# Patient Record
Sex: Female | Born: 1985
Health system: Southern US, Community
[De-identification: ages and names within clinical notes are randomized; demographics above are authoritative.]

## PROBLEM LIST (undated history)

## (undated) DIAGNOSIS — G8929 Other chronic pain: Secondary | ICD-10-CM

## (undated) DIAGNOSIS — F419 Anxiety disorder, unspecified: Secondary | ICD-10-CM

## (undated) DIAGNOSIS — G43909 Migraine, unspecified, not intractable, without status migrainosus: Secondary | ICD-10-CM

## (undated) DIAGNOSIS — B009 Herpesviral infection, unspecified: Secondary | ICD-10-CM

## (undated) DIAGNOSIS — M797 Fibromyalgia: Secondary | ICD-10-CM

## (undated) DIAGNOSIS — IMO0002 Reserved for concepts with insufficient information to code with codable children: Secondary | ICD-10-CM

## (undated) DIAGNOSIS — R87619 Unspecified abnormal cytological findings in specimens from cervix uteri: Secondary | ICD-10-CM

## (undated) DIAGNOSIS — M549 Dorsalgia, unspecified: Secondary | ICD-10-CM

## (undated) HISTORY — DX: Reserved for concepts with insufficient information to code with codable children: IMO0002

## (undated) HISTORY — DX: Unspecified abnormal cytological findings in specimens from cervix uteri: R87.619

## (undated) HISTORY — DX: Herpesviral infection, unspecified: B00.9

## (undated) HISTORY — PX: LEEP: SHX91

---
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
## 2004-04-26 ENCOUNTER — Emergency Department (HOSPITAL_COMMUNITY): Admission: EM | Admit: 2004-04-26 | Discharge: 2004-04-26 | Payer: Self-pay | Admitting: Emergency Medicine

## 2004-08-17 ENCOUNTER — Emergency Department (HOSPITAL_COMMUNITY): Admission: EM | Admit: 2004-08-17 | Discharge: 2004-08-17 | Payer: Self-pay | Admitting: Emergency Medicine

## 2005-03-04 ENCOUNTER — Emergency Department (HOSPITAL_COMMUNITY): Admission: EM | Admit: 2005-03-04 | Discharge: 2005-03-05 | Payer: Self-pay | Admitting: *Deleted

## 2006-08-16 ENCOUNTER — Ambulatory Visit (HOSPITAL_COMMUNITY): Admission: AD | Admit: 2006-08-16 | Discharge: 2006-08-16 | Payer: Self-pay | Admitting: Obstetrics and Gynecology

## 2006-09-13 ENCOUNTER — Ambulatory Visit (HOSPITAL_COMMUNITY): Admission: AD | Admit: 2006-09-13 | Discharge: 2006-09-13 | Payer: Self-pay | Admitting: Obstetrics and Gynecology

## 2006-09-19 ENCOUNTER — Observation Stay (HOSPITAL_COMMUNITY): Admission: AD | Admit: 2006-09-19 | Discharge: 2006-09-19 | Payer: Self-pay | Admitting: Obstetrics and Gynecology

## 2006-10-11 ENCOUNTER — Inpatient Hospital Stay (HOSPITAL_COMMUNITY): Admission: AD | Admit: 2006-10-11 | Discharge: 2006-10-13 | Payer: Self-pay | Admitting: Obstetrics and Gynecology

## 2006-11-28 ENCOUNTER — Ambulatory Visit (HOSPITAL_COMMUNITY): Admission: RE | Admit: 2006-11-28 | Discharge: 2006-11-28 | Payer: Self-pay | Admitting: Obstetrics and Gynecology

## 2007-09-08 ENCOUNTER — Emergency Department (HOSPITAL_COMMUNITY): Admission: EM | Admit: 2007-09-08 | Discharge: 2007-09-08 | Payer: Self-pay | Admitting: Emergency Medicine

## 2007-09-23 ENCOUNTER — Emergency Department (HOSPITAL_COMMUNITY): Admission: EM | Admit: 2007-09-23 | Discharge: 2007-09-23 | Payer: Self-pay | Admitting: Emergency Medicine

## 2007-11-29 ENCOUNTER — Emergency Department (HOSPITAL_COMMUNITY): Admission: EM | Admit: 2007-11-29 | Discharge: 2007-11-29 | Payer: Self-pay | Admitting: Emergency Medicine

## 2007-12-01 ENCOUNTER — Emergency Department (HOSPITAL_COMMUNITY): Admission: EM | Admit: 2007-12-01 | Discharge: 2007-12-01 | Payer: Self-pay | Admitting: Emergency Medicine

## 2008-05-28 ENCOUNTER — Encounter: Payer: Self-pay | Admitting: Orthopedic Surgery

## 2008-05-28 ENCOUNTER — Emergency Department (HOSPITAL_COMMUNITY): Admission: EM | Admit: 2008-05-28 | Discharge: 2008-05-28 | Payer: Self-pay | Admitting: Emergency Medicine

## 2008-06-11 ENCOUNTER — Emergency Department (HOSPITAL_COMMUNITY): Admission: EM | Admit: 2008-06-11 | Discharge: 2008-06-11 | Payer: Self-pay | Admitting: Emergency Medicine

## 2008-06-13 ENCOUNTER — Encounter: Payer: Self-pay | Admitting: Orthopedic Surgery

## 2008-06-20 ENCOUNTER — Ambulatory Visit: Payer: Self-pay | Admitting: Orthopedic Surgery

## 2008-06-20 ENCOUNTER — Encounter (INDEPENDENT_AMBULATORY_CARE_PROVIDER_SITE_OTHER): Payer: Self-pay | Admitting: *Deleted

## 2008-06-20 DIAGNOSIS — M24876 Other specific joint derangements of unspecified foot, not elsewhere classified: Secondary | ICD-10-CM

## 2008-06-20 DIAGNOSIS — M25579 Pain in unspecified ankle and joints of unspecified foot: Secondary | ICD-10-CM

## 2008-06-20 DIAGNOSIS — M24873 Other specific joint derangements of unspecified ankle, not elsewhere classified: Secondary | ICD-10-CM

## 2008-10-15 ENCOUNTER — Other Ambulatory Visit: Admission: RE | Admit: 2008-10-15 | Discharge: 2008-10-15 | Payer: Self-pay | Admitting: Obstetrics & Gynecology

## 2008-11-20 ENCOUNTER — Emergency Department (HOSPITAL_COMMUNITY): Admission: EM | Admit: 2008-11-20 | Discharge: 2008-11-20 | Payer: Self-pay | Admitting: Emergency Medicine

## 2008-12-01 ENCOUNTER — Emergency Department (HOSPITAL_COMMUNITY): Admission: EM | Admit: 2008-12-01 | Discharge: 2008-12-01 | Payer: Self-pay | Admitting: Emergency Medicine

## 2009-01-20 ENCOUNTER — Emergency Department (HOSPITAL_COMMUNITY): Admission: EM | Admit: 2009-01-20 | Discharge: 2009-01-20 | Payer: Self-pay | Admitting: Emergency Medicine

## 2009-04-04 ENCOUNTER — Emergency Department (HOSPITAL_COMMUNITY): Admission: EM | Admit: 2009-04-04 | Discharge: 2009-04-04 | Payer: Self-pay | Admitting: Emergency Medicine

## 2009-05-06 ENCOUNTER — Emergency Department (HOSPITAL_COMMUNITY): Admission: EM | Admit: 2009-05-06 | Discharge: 2009-05-06 | Payer: Self-pay | Admitting: Emergency Medicine

## 2009-06-06 ENCOUNTER — Emergency Department (HOSPITAL_COMMUNITY): Admission: EM | Admit: 2009-06-06 | Discharge: 2009-06-06 | Payer: Self-pay | Admitting: Emergency Medicine

## 2009-08-18 ENCOUNTER — Other Ambulatory Visit: Admission: RE | Admit: 2009-08-18 | Discharge: 2009-08-18 | Payer: Self-pay | Admitting: Obstetrics & Gynecology

## 2009-11-28 ENCOUNTER — Emergency Department (HOSPITAL_COMMUNITY): Admission: EM | Admit: 2009-11-28 | Discharge: 2009-11-28 | Payer: Self-pay | Admitting: Emergency Medicine

## 2010-01-24 ENCOUNTER — Emergency Department (HOSPITAL_COMMUNITY): Admission: EM | Admit: 2010-01-24 | Discharge: 2010-01-24 | Payer: Self-pay | Admitting: Emergency Medicine

## 2010-05-31 ENCOUNTER — Emergency Department (HOSPITAL_COMMUNITY): Admission: EM | Admit: 2010-05-31 | Discharge: 2010-05-31 | Payer: Self-pay | Admitting: Emergency Medicine

## 2010-06-01 ENCOUNTER — Emergency Department (HOSPITAL_COMMUNITY): Admission: EM | Admit: 2010-06-01 | Discharge: 2010-06-01 | Payer: Self-pay | Admitting: Emergency Medicine

## 2010-09-24 ENCOUNTER — Emergency Department (HOSPITAL_COMMUNITY): Admission: EM | Admit: 2010-09-24 | Discharge: 2010-09-24 | Payer: Self-pay | Admitting: Emergency Medicine

## 2010-12-20 ENCOUNTER — Encounter: Payer: Self-pay | Admitting: Obstetrics and Gynecology

## 2010-12-20 ENCOUNTER — Encounter: Payer: Self-pay | Admitting: Neurology

## 2011-02-10 LAB — URINALYSIS, ROUTINE W REFLEX MICROSCOPIC
Glucose, UA: NEGATIVE mg/dL
Hgb urine dipstick: NEGATIVE
Nitrite: NEGATIVE
Protein, ur: NEGATIVE mg/dL
Specific Gravity, Urine: 1.02 (ref 1.005–1.030)
Urobilinogen, UA: 1 mg/dL (ref 0.0–1.0)
pH: 6.5 (ref 5.0–8.0)

## 2011-02-10 LAB — PREGNANCY, URINE: Preg Test, Ur: NEGATIVE

## 2011-03-08 LAB — STREP A DNA PROBE

## 2011-03-15 LAB — BASIC METABOLIC PANEL
CO2: 24 mEq/L (ref 19–32)
Calcium: 10.1 mg/dL (ref 8.4–10.5)
Chloride: 106 mEq/L (ref 96–112)
GFR calc Af Amer: >60 mL/min (ref >60)
Glucose, Bld: 122 mg/dL — ABNORMAL HIGH (ref 70–99)
Potassium: 3.5 mEq/L (ref 3.5–5.1)
Sodium: 141 mEq/L (ref 135–145)

## 2011-03-15 LAB — CBC
HCT: 47.7 % — ABNORMAL HIGH (ref 36.0–46.0)
Hemoglobin: 16.2 g/dL — ABNORMAL HIGH (ref 12.0–15.0)
MCHC: 34 g/dL (ref 30.0–36.0)
RBC: 5.34 MIL/uL — ABNORMAL HIGH (ref 3.87–5.11)

## 2011-03-15 LAB — URINALYSIS, ROUTINE W REFLEX MICROSCOPIC
Glucose, UA: NEGATIVE mg/dL
Hgb urine dipstick: NEGATIVE
Protein, ur: 30 mg/dL — AB
Specific Gravity, Urine: >1.03 — ABNORMAL HIGH (ref 1.005–1.030)

## 2011-03-15 LAB — URINE MICROSCOPIC-ADD ON

## 2011-03-15 LAB — DIFFERENTIAL
Basophils Relative: 0 % (ref 0–1)
Eosinophils Relative: 0 % (ref 0–5)
Monocytes Absolute: 0.4 10*3/uL (ref 0.1–1.0)
Monocytes Relative: 3 % (ref 3–12)
Neutro Abs: 11.9 10*3/uL — ABNORMAL HIGH (ref 1.7–7.7)

## 2011-04-16 NOTE — H&P (Signed)
Erika Mcdonald, Erika Mcdonald             ACCOUNT NO.:  [id number]   MEDICAL RECORD NO.:  [id number]           PATIENT TYPE:  OIB   LOCATION:  A401                          FACILITY:  [facility]   PHYSICIAN:  Tilda Burrow, M.D. DATE OF BIRTH:  1986-04-18   DATE OF ADMISSION:  DATE OF DISCHARGE:  LH                                HISTORY & PHYSICAL   ADMITTING DIAGNOSES:  1. Pregnancy at 38-1/2 weeks' gestation.  2. Prodromal labor symptoms.  3. Advanced cervical favorability.  4. Elective induction.   HISTORY OF PRESENT ILLNESS:  This 25 year old female, gravida 3, para 0,  Ab0, LMP January 03, 2006, placing [facility] at October 12, 2006, with ultrasound  [facility] of October 21, 2006, based on 16-week ultrasound.  She has been  followed through our office with steady fundal height growth.  She has small  babies.  She has not slept for 3 days, has been just having irregular  contractions with lots of pelvic pressure and discomfort.  Cervix has  progressed to 3 cm, 50%, -2.  [place], has been seen in the emergency room at [facility] in the past  week and is admitted for induction of labor as per patient request.   PAST MEDICAL HISTORY:  Benign.   SURGICAL HISTORY:  LEEP and conization of the cervix prior to last  pregnancy.   ALLERGIES:  SULFA and CIPRO.   HABITS:  Two packs per day of cigarettes.  Alcohol and recreational drugs:  None.   PHYSICAL EXAMINATION:  GENERAL:  Physical exam reveals a healthy, term  fetus, estimated fetal weight of 6 pounds 12 ounces.  VITAL SIGNS:  Weight 164.  Blood pressure 104/70.  HEENT:  Pupils equal, round and reactive to light.  Extraocular movements  intact.  CHEST:  Nontender.  ABDOMEN:  Nontender.  PELVIC:  Cervix 3, 50%, -2.   PLAN:  Pitocin induction upon arrival to labor and delivery, probably  vaginal delivery tonight.      Tilda Burrow, M.D.  Electronically Signed     JVF/MEDQ  D:   10/11/2006  T:  10/11/2006  Job:  [id number]

## 2011-04-16 NOTE — Op Note (Signed)
NAMEKELINA, BEAUCHAMP             ACCOUNT NO.:  [id number]   MEDICAL RECORD NO.:  [id number]          PATIENT TYPE:  OIB   LOCATION:  A401                          FACILITY:  [facility]   PHYSICIAN:  Tilda Burrow, M.D. DATE OF BIRTH:  [birth year]   DATE OF PROCEDURE:  10/11/2006  DATE OF DISCHARGE:                                 OPERATIVE REPORT   PROCEDURE PERFORMED:  Continuous lumbar epidural catheter placement using  loss of resistance technique.   SURGEON:  Tilda Burrow, M.D.   DESCRIPTION OF THE PROCEDURE:  The patient was 6 cm when I was called and  she was progressing rapidly.  She was 8 cm when sat up for the epidural  _placed easily on the first__  attempt.  An epidural was placed using loss  of resistance technique on the first attempt.  At 4.5 cm from the skin depth  we were able to identify the epidural space and place a 5 mL test dose of  1.5% Xylocaine with epinephrine with the patient catheter placed 3 cm into  the epidural space without resistance and taped to the back.  The patient  was placed in the supine position and already had the irresistible urge to  push. Exam showed her to be completely dilated.  We will simply allow the  initial dose to set up and use that for the delivery.      Tilda Burrow, M.D.  Electronically Signed     JVF/MEDQ  D:  10/11/2006  T:  10/12/2006  Job:  [id number]

## 2011-04-16 NOTE — H&P (Signed)
Erika Mcdonald, Erika Mcdonald             ACCOUNT NO.:  [id number]   MEDICAL RECORD NO.:  [id number]          PATIENT TYPE:  AMB   LOCATION:  DAY                           FACILITY:  [facility]   PHYSICIAN:  Tilda Burrow, M.D. DATE OF BIRTH:  01-30-1986   DATE OF ADMISSION:  DATE OF DISCHARGE:  LH                              HISTORY & PHYSICAL   CHIEF COMPLAINT:  Desire for permanent sterilization, electively.   HISTORY OF PRESENT ILLNESS:  Lilymae is 25 year old gravida 3, para 3,  who had her last baby on October 16, 2006, who presents to the office  with desire for elective permanent sterilization.  The risks and  benefits of sterilization have been discussed including other long term,  although reversible options.  She adamantly denies the desire for any  more children ever and is unwilling to consider any other form of birth  control, including IUD and __implanon __  .  She signed permission for  to BTL on August 10, 2006.   PAST MEDICAL HISTORY:  Positive for chronic back pain, although she has  never had a study done to diagnosis it.  She does have an MRI study  scheduled for November 21, 2006.   PAST SURGICAL HISTORY:  Positive for a LEEP procedure.   ALLERGIES:  1. SULFA.  2. CIPRO.   MEDICATIONS:  1. Xanax p.r.n. which she obtains off the street and via prescription.  2. Skelaxin 800 mg one p.o. t.i.d. for back pain.  3. Lortab 5/500 to 10/500 p.o. which she also obtains without a      prescription.   SOCIAL HISTORY:  Smokes two packs of cigarettes a day.  The father of  the baby is involved and supportive.  She denies illicit/illegal drug  use.   FAMILY HISTORY:  Positive for diabetes and hypertension.   OBSTETRICAL HISTORY:  She has had three vaginal deliveries in 2002,  2003, and 2007, without complications.   PHYSICAL EXAMINATION:  HEENT: Within normal limits.  HEART:  Regular rate and rhythm.  LUNGS:  Clear.  ABDOMEN:  Soft and nontender.  PELVIC:   Normal.  Uterus is well involuted.  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  There are no lesions present.  LEGS:  Negative.   IMPRESSION:  A 25 year old gravida 3, para 3, desire for elective  permanent sterilization.   PLAN:  Co-exam with Dr. Emelda Fear.  GC/Chlamydia culture was taken.  She  has a pre-op visit at [facility] on November 28, 2006 at 13:30  with plans for surgery at [facility] on December 01, 2006 at 9.      Jacklyn Shell, C.N.M.      Tilda Burrow, M.D.  Electronically Signed    FC/MEDQ  D:  11/18/2006  T:  11/18/2006  Job:  [id number]

## 2011-04-16 NOTE — Op Note (Signed)
[name], [name]             ACCOUNT NO.:  [id number]   MEDICAL RECORD NO.:  [id number]          PATIENT TYPE:  OIB   LOCATION:  A401                          FACILITY:  [facility]   PHYSICIAN:  Tilda Burrow, M.D. DATE OF BIRTH:  November 12, 1986   DATE OF PROCEDURE:  DATE OF DISCHARGE:                                  PROCEDURE NOTE   DELIVERY NOTE:  Immediately after the epidural catheter was threaded and  taped down, Erika Mcdonald complained that the baby was coming.  I examined her,  and sure enough it was.  We put her in stirrups in a pushing position, and  after about three good pushes she had a spontaneous vaginal delivery of a  viable female infant.  There were two nuchal cords that were loose that were  reduced before delivery of the body.  The body delivered without difficulty  after a left compound hand was reduced.  Weight is 6 pounds 4 ounces, Apgars  are 9 and 9.  Twenty units of Pitocin diluted in 1000 mL of lactated  Ringer's was infused rapidly IV.  The placenta separated spontaneously and  delivered via controlled cord traction at 1715.  It was inspected and  appears to be intact with a three-vessel cord.  The fundus was immediately  firm.  Estimated blood loss 250 mL.  The vagina was inspected, and no  lacerations were found.  The epidural catheter was then removed with the  blue tip visualized as being intact.      Jacklyn Shell, C.N.M.      Tilda Burrow, M.D.  Electronically Signed    FC/MEDQ  D:  10/11/2006  T:  10/12/2006  Job:  [id number]

## 2011-04-16 NOTE — Group Therapy Note (Signed)
Erika Mcdonald, Erika Mcdonald             ACCOUNT NO.:  [id number]   MEDICAL RECORD NO.:  [id number]          PATIENT TYPE:  OIB   LOCATION:  LDR1                          FACILITY:  [facility]   PHYSICIAN:  Tilda Burrow, M.D. DATE OF BIRTH:  [birth year]   DATE OF PROCEDURE:  DATE OF DISCHARGE:  09/13/2006                                   PROGRESS NOTE   Erika Mcdonald is 34 weeks 6 days gestation with her third baby.  Came with  complaints of cramping and abdominal tightening and back pain.  She does  have chronic back pain but she states that this is worse and is  intermittent.  She also states that this has been going on for three days  and it is mainly in the evening time and at night.  A cervical exam reveals  a cervix that is 1-2 cm, 75% effaced, -1 station.  She has not had previous  exams to compare this to.  At this time, she is not showing any contractions  on the monitor.  She did have a contraction provoked by the cervical exam  which was palpable and the toco moved to that spot on her abdomen.  At this  point, we will continue to monitor her for contractions and treat them  accordingly.      Jacklyn Shell, C.N.M.      Tilda Burrow, M.D.  Electronically Signed    FC/MEDQ  D:  09/13/2006  T:  09/14/2006  Job:  [id number]   cc:   [facility]

## 2011-05-12 ENCOUNTER — Emergency Department (HOSPITAL_COMMUNITY)
Admission: EM | Admit: 2011-05-12 | Discharge: 2011-05-12 | Disposition: A | Discharge status: Home / Self Care | Payer: Medicaid Other | Attending: Emergency Medicine | Admitting: Emergency Medicine

## 2011-05-12 ENCOUNTER — Emergency Department (HOSPITAL_COMMUNITY): Payer: Medicaid Other

## 2011-05-12 DIAGNOSIS — F17200 Nicotine dependence, unspecified, uncomplicated: Secondary | ICD-10-CM | POA: Insufficient documentation

## 2011-05-12 DIAGNOSIS — K089 Disorder of teeth and supporting structures, unspecified: Secondary | ICD-10-CM | POA: Insufficient documentation

## 2011-05-12 DIAGNOSIS — S90129A Contusion of unspecified lesser toe(s) without damage to nail, initial encounter: Secondary | ICD-10-CM | POA: Insufficient documentation

## 2011-05-12 DIAGNOSIS — IMO0002 Reserved for concepts with insufficient information to code with codable children: Secondary | ICD-10-CM | POA: Insufficient documentation

## 2011-05-12 DIAGNOSIS — IMO0001 Reserved for inherently not codable concepts without codable children: Secondary | ICD-10-CM | POA: Insufficient documentation

## 2011-08-18 LAB — URINALYSIS, ROUTINE W REFLEX MICROSCOPIC
Bilirubin Urine: NEGATIVE
Specific Gravity, Urine: >1.03 — ABNORMAL HIGH
Urobilinogen, UA: 0.2

## 2011-08-18 LAB — COMPREHENSIVE METABOLIC PANEL
ALT: 10
AST: 16
Albumin: 4.3
Alkaline Phosphatase: 48
Chloride: 108
GFR calc Af Amer: >60
Potassium: 4
Total Bilirubin: 0.9

## 2011-08-18 LAB — CBC
Platelets: 204
RBC: 5.08
WBC: 10.1

## 2011-08-18 LAB — DIFFERENTIAL
Basophils Absolute: 0
Basophils Relative: 0
Eosinophils Absolute: 0
Eosinophils Relative: 0
Lymphocytes Relative: 6 — ABNORMAL LOW
Monocytes Absolute: 0.2

## 2011-08-18 LAB — URINE MICROSCOPIC-ADD ON

## 2011-09-03 LAB — GC/CHLAMYDIA PROBE AMP, GENITAL: Chlamydia, DNA Probe: NEGATIVE

## 2011-09-03 LAB — CBC
Hemoglobin: 13
RBC: 4.37
RDW: 12.6
WBC: 8.2

## 2011-09-03 LAB — DIFFERENTIAL
Basophils Absolute: 0
Lymphocytes Relative: 45
Monocytes Absolute: 0.4
Monocytes Relative: 5
Neutro Abs: 4

## 2011-09-03 LAB — URINE MICROSCOPIC-ADD ON

## 2011-09-03 LAB — URINALYSIS, ROUTINE W REFLEX MICROSCOPIC
Glucose, UA: NEGATIVE
Leukocytes, UA: NEGATIVE
Protein, ur: NEGATIVE
Urobilinogen, UA: 0.2

## 2011-09-03 LAB — BASIC METABOLIC PANEL
Calcium: 9.5
GFR calc Af Amer: >60
GFR calc non Af Amer: >60
Sodium: 140

## 2011-09-03 LAB — WET PREP, GENITAL: Trich, Wet Prep: NONE SEEN

## 2011-09-16 ENCOUNTER — Emergency Department (HOSPITAL_COMMUNITY)
Admission: EM | Admit: 2011-09-16 | Discharge: 2011-09-16 | Disposition: A | Discharge status: Home / Self Care | Payer: Medicaid Other | Attending: Emergency Medicine | Admitting: Emergency Medicine

## 2011-09-16 ENCOUNTER — Encounter: Payer: Self-pay | Admitting: Emergency Medicine

## 2011-09-16 ENCOUNTER — Other Ambulatory Visit: Payer: Self-pay

## 2011-09-16 DIAGNOSIS — M549 Dorsalgia, unspecified: Secondary | ICD-10-CM | POA: Insufficient documentation

## 2011-09-16 DIAGNOSIS — K0889 Other specified disorders of teeth and supporting structures: Secondary | ICD-10-CM

## 2011-09-16 DIAGNOSIS — K0381 Cracked tooth: Secondary | ICD-10-CM | POA: Insufficient documentation

## 2011-09-16 DIAGNOSIS — K089 Disorder of teeth and supporting structures, unspecified: Secondary | ICD-10-CM | POA: Insufficient documentation

## 2011-09-16 DIAGNOSIS — IMO0001 Reserved for inherently not codable concepts without codable children: Secondary | ICD-10-CM | POA: Insufficient documentation

## 2011-09-16 DIAGNOSIS — F17200 Nicotine dependence, unspecified, uncomplicated: Secondary | ICD-10-CM | POA: Insufficient documentation

## 2011-09-16 HISTORY — DX: Anxiety disorder, unspecified: F41.9

## 2011-09-16 HISTORY — DX: Other chronic pain: G89.29

## 2011-09-16 HISTORY — DX: Dorsalgia, unspecified: M54.9

## 2011-09-16 HISTORY — DX: Fibromyalgia: M79.7

## 2011-09-16 MED ORDER — OXYCODONE-ACETAMINOPHEN 5-325 MG PO TABS
2.0000 | ORAL_TABLET | Freq: Once | ORAL | Status: AC
  Administered 2011-09-16: 2 via ORAL
  Filled 2011-09-16: qty 2

## 2011-09-16 MED ORDER — OXYCODONE-ACETAMINOPHEN 5-325 MG PO TABS: 1.0000 | ORAL_TABLET | ORAL | Status: AC | PRN

## 2011-09-16 MED ORDER — PENICILLIN V POTASSIUM 250 MG PO TABS
500.0000 mg | ORAL_TABLET | Freq: Once | ORAL | Status: AC
  Administered 2011-09-16: 500 mg via ORAL
  Filled 2011-09-16: qty 2

## 2011-09-16 MED ORDER — PENICILLIN V POTASSIUM 500 MG PO TABS: 500.0000 mg | ORAL_TABLET | Freq: Four times a day (QID) | ORAL | Status: AC

## 2011-09-16 MED ORDER — BUPIVACAINE-EPINEPHRINE PF 0.5-1:200000 % IJ SOLN
10.0000 mL | Freq: Once | INTRAMUSCULAR | Status: AC
  Administered 2011-09-16: 50 mg
  Filled 2011-09-16: qty 10

## 2011-09-16 NOTE — ED Provider Notes (Signed)
History     CSN: [id number] Arrival date & time: 09/16/2011  4:13 PM   First MD Initiated Contact with Patient 09/16/11 1627      Chief Complaint  Patient presents with  . Dental Pain    (Consider location/radiation/quality/duration/timing/severity/associated sxs/prior treatment) Patient is a 25 y.o. female presenting with tooth pain. The history is provided by the patient.  Dental PainPrimary symptoms comment: Patient states left lower wisdom tooth broke two days ago and has been breaking more since.   The symptoms began 2 days ago. The symptoms are unchanged. The symptoms occur constantly.  Additional symptoms include: dental sensitivity to temperature and gum swelling. Associated symptoms comments: Patient seen by oral surgeon for removal of right lower molar but states she hasn't seen him for this.  .    Past Medical History  Diagnosis Date  . Anxiety   . Chronic back pain   . Fibromyalgia     History reviewed. No pertinent past surgical history.  History reviewed. No pertinent family history.  History  Substance Use Topics  . Smoking status: Current Everyday Smoker    Types: Cigarettes  . Smokeless tobacco: Not on file  . Alcohol Use: No    OB History    Grav Para Term Preterm Abortions TAB SAB Ect Mult Living                  Review of Systems  All other systems reviewed and are negative.    Allergies  Ciprocin-fluocin-procin; Keppra; Neurontin; Sulfa antibiotics; Tramadol; and Ultram  Home Medications  No current outpatient prescriptions on file.  BP 121/77  Pulse 98  Temp(Src) 98 F (36.7 C) (Oral)  Resp 18  Ht 5\' 3"  (1.6 m)  Wt 133 lb (60.328 kg)  BMI 23.56 kg/m2  SpO2 95%  LMP 08/30/2011  Physical Exam  Vitals reviewed. Constitutional: She is oriented to person, place, and time. She appears well-developed and well-nourished.  HENT:  Head: Normocephalic and atraumatic.       Left lower third molar with missing tooth ?caries, no swelling  around tooth.  No submandibular or facial swelling.  Eyes: Conjunctivae and EOM are normal. Pupils are equal, round, and reactive to light.  Neck: Normal range of motion. Neck supple.  Cardiovascular: Normal rate.   Pulmonary/Chest: Effort normal and breath sounds normal.  Abdominal: Soft. Bowel sounds are normal.  Neurological: She is alert and oriented to person, place, and time. She has normal reflexes.  Skin: Skin is warm and dry.  Psychiatric: She has a normal mood and affect.    ED Course  Procedures (including critical care time)  Labs Reviewed - No data to display No results found.   No diagnosis found.    MDM  Patient offered dental block but refused.  She has an appointment with Dr. Jeanice Lim in two weeks- advised to call and be seen asap.        Hilario Quarry, MD 09/18/11 [phone]

## 2011-09-16 NOTE — ED Notes (Signed)
Pt now c/o chest pain 

## 2011-09-16 NOTE — ED Notes (Signed)
PT c/o toothache from broken wisdom tooth x 2 days.  Says was sitting in waiting room and had sudden onset of centralized chest pain.  Pt reports history of anxiety attacks but says this is worse than usual.  Pt says could cry because of toothache.

## 2011-09-16 NOTE — ED Notes (Signed)
Pt c/o dental pain to the left upper jaw.

## 2012-02-16 ENCOUNTER — Emergency Department (HOSPITAL_COMMUNITY)
Admission: EM | Admit: 2012-02-16 | Discharge: 2012-02-16 | Disposition: A | Discharge status: Home / Self Care | Payer: Self-pay | Attending: Emergency Medicine | Admitting: Emergency Medicine

## 2012-02-16 ENCOUNTER — Encounter (HOSPITAL_COMMUNITY): Payer: Self-pay

## 2012-02-16 DIAGNOSIS — H6690 Otitis media, unspecified, unspecified ear: Secondary | ICD-10-CM | POA: Insufficient documentation

## 2012-02-16 DIAGNOSIS — K089 Disorder of teeth and supporting structures, unspecified: Secondary | ICD-10-CM | POA: Insufficient documentation

## 2012-02-16 DIAGNOSIS — G8929 Other chronic pain: Secondary | ICD-10-CM | POA: Insufficient documentation

## 2012-02-16 DIAGNOSIS — IMO0001 Reserved for inherently not codable concepts without codable children: Secondary | ICD-10-CM | POA: Insufficient documentation

## 2012-02-16 DIAGNOSIS — F411 Generalized anxiety disorder: Secondary | ICD-10-CM | POA: Insufficient documentation

## 2012-02-16 DIAGNOSIS — M549 Dorsalgia, unspecified: Secondary | ICD-10-CM | POA: Insufficient documentation

## 2012-02-16 DIAGNOSIS — K0889 Other specified disorders of teeth and supporting structures: Secondary | ICD-10-CM

## 2012-02-16 DIAGNOSIS — B86 Scabies: Secondary | ICD-10-CM | POA: Insufficient documentation

## 2012-02-16 MED ORDER — HYDROCODONE-ACETAMINOPHEN 5-325 MG PO TABS
1.0000 | ORAL_TABLET | Freq: Once | ORAL | Status: AC
  Administered 2012-02-16: 1 via ORAL
  Filled 2012-02-16: qty 1

## 2012-02-16 MED ORDER — AMOXICILLIN 250 MG PO CAPS
500.0000 mg | ORAL_CAPSULE | Freq: Once | ORAL | Status: AC
  Administered 2012-02-16: 500 mg via ORAL
  Filled 2012-02-16: qty 2

## 2012-02-16 MED ORDER — HYDROCODONE-ACETAMINOPHEN 5-325 MG PO TABS: ORAL_TABLET | ORAL | Status: AC

## 2012-02-16 MED ORDER — AMOXICILLIN 500 MG PO CAPS: 500.0000 mg | ORAL_CAPSULE | Freq: Three times a day (TID) | ORAL | Status: AC

## 2012-02-16 MED ORDER — PERMETHRIN 5 % EX CREA: TOPICAL_CREAM | CUTANEOUS | Status: AC

## 2012-02-16 NOTE — ED Notes (Signed)
Pt reports has had rash all over x 3 months.  Says was around her nephew who had scabies.  Also c/o left earache, toothache, lower back pain, and abd pain.  Abd pain has been present for 3 weeks.  Denies n/v/d.  Denies any abnormal bleeding or discharge.

## 2012-02-16 NOTE — ED Notes (Signed)
Pt seen for primary assessment by EDPa.

## 2012-02-16 NOTE — ED Provider Notes (Signed)
History     CSN: [id number]  Arrival date & time 02/16/12  1237   First MD Initiated Contact with Patient 02/16/12 1301      Chief Complaint  Patient presents with  . Abdominal Pain  . Back Pain  . Rash    (Consider location/radiation/quality/duration/timing/severity/associated sxs/prior treatment) HPI Comments: Patient complains of dental pain for several days. States that she has a cavity to her tooth. Complains of tenderness to her gums. She denies difficulty swallowing, fever, or neck pain. Patient also complains of a diffuse rash for several months. Rash has been mostly to her trunk but also her legs and arms. She states the rash is very "itchy".  She states that she has possibly been exposed to scabies. She denies pustules vesicles or drainage. Patient denied any abdominal pain or back pain during my evaluation  Patient is a 26 y.o. female presenting with tooth pain and rash. The history is provided by the patient. No language interpreter was used.  Dental PainThe primary symptoms include mouth pain. Primary symptoms do not include dental injury, oral bleeding, headaches, fever, shortness of breath, sore throat, angioedema or cough. The symptoms began 3 to 5 days ago. The symptoms are worsening. The symptoms are new. The symptoms occur constantly.  Mouth pain began 3 - 5 days ago. Mouth pain occurs constantly. Affected locations include: teeth and gum(s).  Additional symptoms include: dental sensitivity to temperature, gum tenderness and ear pain. Additional symptoms do not include: gum swelling, purulent gums, trismus, jaw pain, facial swelling and trouble swallowing. Medical issues include: smoking and periodontal disease.   Rash  This is a new problem. The current episode started more than 1 week ago. The problem has not changed since onset.Associated with: Possible contact with scabies. There has been no fever. The rash is present on the torso, left arm, left hand, right arm, right  hand, left upper leg, left lower leg, right lower leg and right upper leg. The patient is experiencing no pain. The pain has been constant since onset. Associated symptoms include itching. Pertinent negatives include no blisters, no pain and no weeping. She has tried anti-itch cream for the symptoms. The treatment provided no relief.    Past Medical History  Diagnosis Date  . Anxiety   . Chronic back pain   . Fibromyalgia     History reviewed. No pertinent past surgical history.  History reviewed. No pertinent family history.  History  Substance Use Topics  . Smoking status: Current Everyday Smoker    Types: Cigarettes  . Smokeless tobacco: Not on file  . Alcohol Use: No    OB History    Grav Para Term Preterm Abortions TAB SAB Ect Mult Living                  Review of Systems  Constitutional: Negative for fever and appetite change.  HENT: Positive for ear pain and dental problem. Negative for sore throat, facial swelling, trouble swallowing, neck pain and neck stiffness.   Respiratory: Negative for cough and shortness of breath.   Skin: Positive for itching and rash. Negative for wound.  Neurological: Negative for headaches.  Hematological: Negative for adenopathy.  All other systems reviewed and are negative.    Allergies  Bee venom; Ciprocin-fluocin-procin; Keppra; Neurontin; Sulfa antibiotics; Tramadol; and Ultram  Home Medications   Current Outpatient Rx  Name Route Sig Dispense Refill  . ALBUTEROL SULFATE HFA 108 (90 BASE) MCG/ACT IN AERS Inhalation Inhale 2 puffs into the  lungs every 6 (six) hours as needed. For shortness of breath     . ALPRAZOLAM 1 MG PO TABS Oral Take 1 mg by mouth 5 (five) times daily. Take one tablet four times daily and take 2 tablets at bedtime.       BP 95/64  Pulse 89  Temp(Src) 97.7 F (36.5 C) (Oral)  Resp 17  Ht 5\' 5"  (1.651 m)  Wt 137 lb 4 oz (62.256 kg)  BMI 22.84 kg/m2  SpO2 100%  LMP 01/25/2012  Physical Exam    Nursing note and vitals reviewed. Constitutional: She is oriented to person, place, and time. She appears well-developed and well-nourished. No distress.  HENT:  Head: Normocephalic and atraumatic.  Right Ear: Tympanic membrane and ear canal normal.  Left Ear: Ear canal normal. No mastoid tenderness. Tympanic membrane is erythematous. No hemotympanum.  Mouth/Throat: Uvula is midline, oropharynx is clear and moist and mucous membranes are normal.       Several left lower dental caries there is mild erythema and edema of the surrounding gums but no dental abscess is seen at this time  Neck: Normal range of motion. Neck supple.  Cardiovascular: Normal rate, regular rhythm and normal heart sounds.   Pulmonary/Chest: Effort normal and breath sounds normal. No respiratory distress.  Musculoskeletal: Normal range of motion. She exhibits no edema and no tenderness.  Neurological: She is alert and oriented to person, place, and time. She exhibits normal muscle tone. Coordination normal.  Skin: Skin is warm and dry. Rash noted.       Diffuse scattered slightly papular rash to most of the body. Multiple areas of excoriation. Palms of hands and soles of the feet are spared.    ED Course  Procedures (including critical care time)       MDM    patient inflammatory with a steady gait. No focal neuro deficits on exam. She denies any abdominal pain or back pain to me on this visit.  Left lower dental caries, mild erythema and edema of the surrounding gums. No obvious dental abscess at this time. Left otitis media is also present. I will treat with antibiotics and a short course of pain medication she agrees to close followup with her primary care physician. Have also given her referral information for a dentist.     Patient / Family / Caregiver understand and agree with initial ED impression and plan with expectations set for ED visit. Pt stable in ED with no significant deterioration in condition.  Pt feels improved after observation and/or treatment in ED.    Fredericka Bottcher L. [place], [place] 02/19/12 1717

## 2012-02-16 NOTE — Discharge Instructions (Signed)
Dental Pain A tooth ache may be caused by cavities (tooth decay). Cavities expose the nerve of the tooth to air and hot or cold temperatures. It may come from an infection or abscess (also called a boil or furuncle) around your tooth. It is also often caused by dental caries (tooth decay). This causes the pain you are having. DIAGNOSIS  Your caregiver can diagnose this problem by exam. TREATMENT   If caused by an infection, it may be treated with medications which kill germs (antibiotics) and pain medications as prescribed by your caregiver. Take medications as directed.   Only take over-the-counter or prescription medicines for pain, discomfort, or fever as directed by your caregiver.   Whether the tooth ache today is caused by infection or dental disease, you should see your dentist as soon as possible for further care.  SEEK MEDICAL CARE IF: The exam and treatment you received today has been provided on an emergency basis only. This is not a substitute for complete medical or dental care. If your problem worsens or new problems (symptoms) appear, and you are unable to meet with your dentist, call or return to this location. SEEK IMMEDIATE MEDICAL CARE IF:   You have a fever.   You develop redness and swelling of your face, jaw, or neck.   You are unable to open your mouth.   You have severe pain uncontrolled by pain medicine.  MAKE SURE YOU:   Understand these instructions.   Will watch your condition.   Will get help right away if you are not doing well or get worse.  Document Released: 11/15/2005 Document Revised: 11/04/2011 Document Reviewed: 07/03/2008 [facility] Patient Information 2012 [place], [place].Otitis Media, Adult A middle ear infection is an infection in the space behind the eardrum. It often happens along with a cold. It is caused by a germ that starts growing in that space. Your neck may feel puffy (swollen) on the side of the ear infection. HOME CARE  Take your  medicine as told. Finish it even if you start to feel better.   Nose medicine (nasal decongestant) may help the tube that connects the ear and throat (eustachian tube) drain better. It may also help with discomfort.   Follow up with your doctor in 10 to 14 days or as told by your doctor. This is to make sure the infection is gone.  GET HELP RIGHT AWAY IF:   You do not start to feel better in 2 to 3 days.   You have pain that is not helped with medicine.   You cannot use the medicine as told.   You feel worse instead of better.   You develop puffiness, redness, or pain around the ear.   You get a stiff neck.  MAKE SURE YOU:   Understand these instructions.   Will watch your condition.   Will get help right away if you are not doing well or get worse.  Document Released: 05/03/2008 Document Revised: 11/04/2011 Document Reviewed: 05/03/2008 [facility] Patient Information 2012 [place], [place].Scabies Scabies are small bugs (mites) that burrow under the skin and cause red bumps and severe itching. These bugs can only be seen with a microscope. Scabies are highly contagious. They can spread easily from person to person by direct contact. They are also spread through sharing clothing or linens that have the scabies mites living in them. It is not unusual for an entire family to become infected through shared towels, clothing, or bedding.  HOME CARE INSTRUCTIONS  Your caregiver may prescribe a cream or lotion to kill the mites. If this cream is prescribed; massage the cream into the entire area of the body from the neck to the bottom of both feet. Also massage the cream into the scalp and face if your child is less than 74 year old. Avoid the eyes and mouth.   Leave the cream on for 8 to12 hours. Do not wash your hands after application. Your child should bathe or shower after the 8 to 12 hour application period. Sometimes it is helpful to apply the cream to your child at right before bedtime.     One treatment is usually effective and will eliminate approximately 95% of infestations. For severe cases, your caregiver may decide to repeat the treatment in 1 week. Everyone in your household should be treated with one application of the cream.   New rashes or burrows should not appear after successful treatment within 24 to 48 hours; however the itching and rash may last for 2 to 4 weeks after successful treatment. If your symptoms persist longer than this, see your caregiver.   Your caregiver also may prescribe a medication to help with the itching or to help the rash go away more quickly.   Scabies can live on clothing or linens for up to 3 days. Your entire child's recently used clothing, towels, stuffed toys, and bed linens should be washed in hot water and then dried in a dryer for at least 20 minutes on high heat. Items that cannot be washed should be enclosed in a plastic bag for at least 3 days.   To help relieve itching, bathe your child in a cool bath or apply cool washcloths to the affected areas.   Your child may return to school after treatment with the prescribed cream.  SEEK MEDICAL CARE IF:   The itching persists longer than 4 weeks after treatment.   The rash spreads or becomes infected (the area has red blisters or yellow-tan crust).  Document Released: 11/15/2005 Document Revised: 11/04/2011 Document Reviewed: 03/26/2009 [facility] Patient Information 2012 [place], [place].

## 2012-02-19 NOTE — ED Provider Notes (Signed)
Medical screening examination/treatment/procedure(s) were performed by non-physician practitioner and as supervising physician I was immediately available for consultation/collaboration.  Valoree Agent S. Telitha Plath, MD 02/19/12 1821 

## 2012-05-09 ENCOUNTER — Other Ambulatory Visit: Payer: Self-pay | Admitting: Adult Health

## 2012-05-09 ENCOUNTER — Other Ambulatory Visit (HOSPITAL_COMMUNITY)
Admission: RE | Admit: 2012-05-09 | Discharge: 2012-05-09 | Disposition: A | Discharge status: Home / Self Care | Payer: Medicaid Other | Source: Ambulatory Visit | Attending: Obstetrics and Gynecology | Admitting: Obstetrics and Gynecology

## 2012-05-09 DIAGNOSIS — Z01419 Encounter for gynecological examination (general) (routine) without abnormal findings: Secondary | ICD-10-CM | POA: Insufficient documentation

## 2012-05-09 DIAGNOSIS — Z113 Encounter for screening for infections with a predominantly sexual mode of transmission: Secondary | ICD-10-CM | POA: Insufficient documentation

## 2012-07-07 ENCOUNTER — Emergency Department (HOSPITAL_COMMUNITY)
Admission: EM | Admit: 2012-07-07 | Discharge: 2012-07-07 | Disposition: A | Discharge status: Home / Self Care | Payer: Medicaid Other | Attending: Emergency Medicine | Admitting: Emergency Medicine

## 2012-07-07 ENCOUNTER — Encounter (HOSPITAL_COMMUNITY): Payer: Self-pay | Admitting: *Deleted

## 2012-07-07 DIAGNOSIS — Z3490 Encounter for supervision of normal pregnancy, unspecified, unspecified trimester: Secondary | ICD-10-CM

## 2012-07-07 DIAGNOSIS — Z881 Allergy status to other antibiotic agents status: Secondary | ICD-10-CM | POA: Insufficient documentation

## 2012-07-07 DIAGNOSIS — O99891 Other specified diseases and conditions complicating pregnancy: Secondary | ICD-10-CM | POA: Insufficient documentation

## 2012-07-07 DIAGNOSIS — Z882 Allergy status to sulfonamides status: Secondary | ICD-10-CM | POA: Insufficient documentation

## 2012-07-07 DIAGNOSIS — Z888 Allergy status to other drugs, medicaments and biological substances status: Secondary | ICD-10-CM | POA: Insufficient documentation

## 2012-07-07 DIAGNOSIS — F19939 Other psychoactive substance use, unspecified with withdrawal, unspecified: Secondary | ICD-10-CM | POA: Insufficient documentation

## 2012-07-07 DIAGNOSIS — O99330 Smoking (tobacco) complicating pregnancy, unspecified trimester: Secondary | ICD-10-CM | POA: Insufficient documentation

## 2012-07-07 DIAGNOSIS — Z79899 Other long term (current) drug therapy: Secondary | ICD-10-CM | POA: Insufficient documentation

## 2012-07-07 DIAGNOSIS — Z886 Allergy status to analgesic agent status: Secondary | ICD-10-CM | POA: Insufficient documentation

## 2012-07-07 DIAGNOSIS — F1320 Sedative, hypnotic or anxiolytic dependence, uncomplicated: Secondary | ICD-10-CM | POA: Insufficient documentation

## 2012-07-07 DIAGNOSIS — Z760 Encounter for issue of repeat prescription: Secondary | ICD-10-CM

## 2012-07-07 DIAGNOSIS — F411 Generalized anxiety disorder: Secondary | ICD-10-CM | POA: Insufficient documentation

## 2012-07-07 DIAGNOSIS — F1920 Other psychoactive substance dependence, uncomplicated: Secondary | ICD-10-CM | POA: Insufficient documentation

## 2012-07-07 DIAGNOSIS — M549 Dorsalgia, unspecified: Secondary | ICD-10-CM | POA: Insufficient documentation

## 2012-07-07 DIAGNOSIS — F13239 Sedative, hypnotic or anxiolytic dependence with withdrawal, unspecified: Secondary | ICD-10-CM

## 2012-07-07 MED ORDER — ALPRAZOLAM 0.5 MG PO TABS
2.0000 mg | ORAL_TABLET | Freq: Once | ORAL | Status: AC
  Administered 2012-07-07: 2 mg via ORAL
  Filled 2012-07-07: qty 4

## 2012-07-07 MED ORDER — ALPRAZOLAM 2 MG PO TABS: 2.0000 mg | ORAL_TABLET | Freq: Four times a day (QID) | ORAL | Status: AC

## 2012-07-07 NOTE — ED Provider Notes (Signed)
History   This chart was scribed for Erika Givens, MD by Erika Mcdonald. The patient was seen in room APA08/APA08 and the patient's care was started at 10:50PM.    CSN: [id number]  Arrival date & time 07/07/12  2040   First MD Initiated Contact with Patient 07/07/12 2217      Chief Complaint  Patient presents with  . Medication Refill    (Consider location/radiation/quality/duration/timing/severity/associated sxs/prior treatment) The history is provided by the patient. No language interpreter was used.   Erika Mcdonald is a 26 y.o. female who presents to the Emergency Department for a klonopin refill today. Pt is [redacted] weeks pregnant; G:4P:3Ab:0, EDC about 09/06/2012. Pt had tubal ligation but still got pregnant. Pt has a Hx of anxiety since 57-75 years old and has been taking klonopin and has been getting meds from Dr. Penelope Galas. She saw Dr. Penelope Galas last week and was told he is no longer accepting her medicaid so she was referred to [facility]. Pt states that she couldn't get her medications from [facility] when she went there today and needed to return on 8/12 to do the initial intake exam. Pt last took her xanax 2 days ago and states she has had seizures in the past when she has stopped taking them.  Pt thought that she was already referred but she wasn't and was told to "call a 1-800 number"; was told that she needs to come to the ED to get meds refilled until she sees her psychiatrist. No HA, fever, neck pain, sore throat, rash, back pain, CP, SOB, abd pain, n/v/d, dysuria, or extremity pain, edema, weakness, numbness, or tingling. Allergic to Ciprofloxacin; Levetiracetam; Sulfa antibiotics; Bee venom; Neurontin; Robaxin; and Ultram. No other pertinent medical symptoms.  PCP: Dr. Benedetto Coons in Dr. Rayna Sexton office  Past Medical History  Diagnosis Date  . Anxiety   . Chronic back pain   . Fibromyalgia   . Pregnant     History reviewed. No pertinent past surgical history.  History reviewed.  No pertinent family history.  History  Substance Use Topics  . Smoking status: Current Everyday Smoker    Types: Cigarettes  . Smokeless tobacco: Not on file  . Alcohol Use: No   unemployed  OB History    Grav Para Term Preterm Abortions TAB SAB Ect Mult Living   1               Review of Systems 10 Systems reviewed and all are negative for acute change except as noted in the HPI.   Allergies  Ciprofloxacin; Levetiracetam; Sulfa antibiotics; Bee venom; Neurontin; Robaxin; and Ultram  Home Medications   Current Outpatient Rx  Name Route Sig Dispense Refill  . ALBUTEROL SULFATE HFA 108 (90 BASE) MCG/ACT IN AERS Inhalation Inhale 2 puffs into the lungs every 6 (six) hours as needed. For shortness of breath     . ALPRAZOLAM 2 MG PO TABS Oral Take 2 mg by mouth 4 (four) times daily.    Marland Kitchen FLINTSTONES/EXTRA C PO CHEW Oral Chew 2 tablets by mouth daily.    . OXYCODONE-ACETAMINOPHEN 7.5-325 MG PO TABS Oral Take 1 tablet by mouth every 6 (six) hours as needed. For pain      BP 123/60  Pulse 76  Temp 98.7 F (37.1 C) (Oral)  Resp 18  Ht 5\' 3"  (1.6 m)  Wt 150 lb (68.04 kg)  BMI 26.57 kg/m2  SpO2 100%  LMP 01/25/2012  Vital signs normal    Physical Exam  Nursing note and vitals reviewed. Constitutional: She is oriented to person, place, and time. She appears well-developed and well-nourished.  Non-toxic appearance. She does not appear ill. No distress.       anxious  HENT:  Head: Normocephalic and atraumatic.  Right Ear: External ear normal.  Left Ear: External ear normal.  Nose: Nose normal. No mucosal edema or rhinorrhea.  Mouth/Throat: Oropharynx is clear and moist and mucous membranes are normal. No dental abscesses or uvula swelling.  Eyes: Conjunctivae and EOM are normal. Pupils are equal, round, and reactive to light.  Neck: Normal range of motion and full passive range of motion without pain. Neck supple. No tracheal deviation present.  Cardiovascular: Normal  rate, regular rhythm and normal heart sounds.  Exam reveals no gallop and no friction rub.   No murmur heard. Pulmonary/Chest: Effort normal and breath sounds normal. No respiratory distress. She has no wheezes. She has no rhonchi. She has no rales. She exhibits no tenderness and no crepitus.  Abdominal: Soft. Normal appearance and bowel sounds are normal. She exhibits no distension. There is no tenderness. There is no rebound and no guarding.       Gravid uterus c/w dates. Nontender. FHR 138  Musculoskeletal: Normal range of motion. She exhibits no edema and no tenderness.       Moves all extremities well.   Neurological: She is alert and oriented to person, place, and time. She has normal strength. No cranial nerve deficit.  Skin: Skin is warm, dry and intact. No rash noted. No erythema. No pallor.  Psychiatric: She has a normal mood and affect. Her speech is normal and behavior is normal. Her mood appears not anxious.    ED Course  Procedures (including critical care time)   Medications  alprazolam (XANAX) 2 MG tablet (not administered)   Review of the [place] controlled substance site shows patient has been getting #120 alprazolam 2 mg tablets on March 8, April 7, May 7, and June 7. She is also getting oxycodone 10/325 on a frequent basis from Dr. Emelda Fear and Dr. Despina Hidden.   DIAGNOSTIC STUDIES: Oxygen Saturation is 100% on room air, normal by my interpretation.    COORDINATION OF CARE:  10:58PM - pt will be given enough xanax to get through the weekend and is advised to get future meds from [facility] or Dr. Rayna Sexton office; pt ready for d/c.    1. Medication refill   2. Withdrawal from benzodiazepine   3. Pregnancy     New Prescriptions   ALPRAZOLAM (XANAX) 2 MG TABLET    Take 1 tablet (2 mg total) by mouth 4 (four) times daily.    Plan discharge  Devoria Albe, MD, FACEP   MDM  I personally performed the services described in this documentation, which was scribed in  my presence. The recorded information has been reviewed and considered.  Devoria Albe, MD, Armando Gang         Erika Givens, MD 07/07/12 [phone]

## 2012-07-07 NOTE — ED Notes (Signed)
Pt stable at discharge Pt to keep appointment at [facility] and with OB MD. Pt verbalizes understanding

## 2012-07-07 NOTE — ED Notes (Addendum)
Out of xanax x  2 days Pt is pregnant.7-8 mos. Sees Dr Despina Hidden  .[facility] advised pt to come here.

## 2012-10-31 ENCOUNTER — Encounter (HOSPITAL_COMMUNITY): Payer: Self-pay | Admitting: Pharmacy Technician

## 2012-11-02 ENCOUNTER — Other Ambulatory Visit (HOSPITAL_COMMUNITY): Payer: Self-pay | Admitting: Obstetrics & Gynecology

## 2012-11-03 ENCOUNTER — Inpatient Hospital Stay (HOSPITAL_COMMUNITY): Admission: RE | Admit: 2012-11-03 | Payer: Medicaid Other | Source: Ambulatory Visit

## 2012-11-07 ENCOUNTER — Encounter (HOSPITAL_COMMUNITY)
Admission: RE | Admit: 2012-11-07 | Discharge: 2012-11-07 | Disposition: A | Discharge status: Home / Self Care | Payer: Medicaid Other | Source: Ambulatory Visit | Attending: Obstetrics & Gynecology | Admitting: Obstetrics & Gynecology

## 2012-11-07 ENCOUNTER — Encounter (HOSPITAL_COMMUNITY): Payer: Self-pay

## 2012-11-07 DIAGNOSIS — Z01812 Encounter for preprocedural laboratory examination: Secondary | ICD-10-CM | POA: Insufficient documentation

## 2012-11-07 DIAGNOSIS — Z5320 Procedure and treatment not carried out because of patient's decision for unspecified reasons: Secondary | ICD-10-CM | POA: Insufficient documentation

## 2012-11-07 LAB — CBC
MCV: 91 fL (ref 78.0–100.0)
Platelets: 262 10*3/uL (ref 150–400)
RBC: 4.45 MIL/uL (ref 3.87–5.11)
WBC: 7 10*3/uL (ref 4.0–10.5)

## 2012-11-07 LAB — URINALYSIS, ROUTINE W REFLEX MICROSCOPIC
Glucose, UA: NEGATIVE mg/dL
Protein, ur: NEGATIVE mg/dL
Urobilinogen, UA: 0.2 mg/dL (ref 0.0–1.0)

## 2012-11-07 LAB — COMPREHENSIVE METABOLIC PANEL
AST: 11 U/L (ref 0–37)
CO2: 28 mEq/L (ref 19–32)
Calcium: 9.6 mg/dL (ref 8.4–10.5)
Creatinine, Ser: 0.63 mg/dL (ref 0.50–1.10)
GFR calc Af Amer: >90 mL/min (ref >90)
GFR calc non Af Amer: >90 mL/min (ref >90)
Total Protein: 6.8 g/dL (ref 6.0–8.3)

## 2012-11-07 LAB — URINE MICROSCOPIC-ADD ON

## 2012-11-07 NOTE — Patient Instructions (Addendum)
20 ELEANOR GATLIFF  11/07/2012   Your procedure is scheduled on:  11/08/2012  Report to [facility] at  830  AM.  Call this number if you have problems the morning of surgery: [phone]   Remember:   Do not eat food:After Midnight.  May have clear liquids:until Midnight .   Take these medicines the morning of surgery with A SIP OF WATER:  percocet   Do not wear jewelry, make-up or nail polish.  Do not wear lotions, powders, or perfumes.   Do not shave 48 hours prior to surgery. Men may shave face and neck.  Do not bring valuables to the hospital.  Contacts, dentures or bridgework may not be worn into surgery.  Leave suitcase in the car. After surgery it may be brought to your room.  For patients admitted to the hospital, checkout time is 11:00 AM the day of discharge.   Patients discharged the day of surgery will not be allowed to drive home.  Name and phone number of your driver: family  Special Instructions: Shower using CHG 2 nights before surgery and the night before surgery.  If you shower the day of surgery use CHG.  Use special wash - you have one bottle of CHG for all showers.  You should use approximately 1/3 of the bottle for each shower.   Please read over the following fact sheets that you were given: Pain Booklet, Coughing and Deep Breathing, MRSA Information, Surgical Site Infection Prevention, Anesthesia Post-op Instructions and Care and Recovery After Surgery Laparoscopic Tubal Ligation Laparoscopic tubal ligation is a procedure that closes the fallopian tubes at a time other than right after childbirth. By closing the fallopian tubes, the eggs that are released from the ovaries cannot enter the uterus and sperm cannot reach the egg. Tubal ligation is also known as getting your "tubes tied." Tubal ligation is done so you will not be able to get pregnant or have a baby.  Although this procedure may be reversed, it should be considered permanent and irreversible. If  you want to have future pregnancies, you should not have this procedure.  LET YOUR CAREGIVER KNOW ABOUT:  Allergies to food or medicine.  Medicines taken, including vitamins, herbs, eyedrops, over-the-counter medicines, and creams.  Use of steroids (by mouth or creams).  Previous problems with numbing medicines.  History of bleeding problems or blood clots.  Any recent colds or infections.  Previous surgery.  Other health problems, including diabetes and kidney problems.  Possibility of pregnancy, if this applies.  Any past pregnancies. RISKS AND COMPLICATIONS   Infection.  Bleeding.  Injury to surrounding organs.  Anesthetic side effects.  Failure of the procedure.  Ectopic pregnancy.  Future regret about having the procedure done. BEFORE THE PROCEDURE  Do not take aspirin or blood thinners a week before the procedure or as directed. This can cause bleeding.  Do not eat or drink anything 6 to 8 hours before the procedure. PROCEDURE   You may be given a medicine to help you relax (sedative) before the procedure. You will be given a medicine to make you sleep (general anesthetic) during the procedure.  A tube will be put down your throat to help your breath while under general anesthesia.  Two small cuts (incisions) are made in the lower abdominal area and near the belly button.  Your abdominal area will be inflated with a safe gas (carbon dioxide). This helps give the surgeon room to operate, visualize, and  helps the surgeon avoid other organs.  A thin, lighted tube (laparoscope) with a camera attached is inserted into your abdomen through one of the incisions near the belly button. Other small instruments are also inserted through the other abdominal incision.  The fallopian tubes are located and are either blocked with a ring, clip, or are burned (cauterized).  After the fallopian tubes are blocked, the gas is released from the abdomen.  The incisions will  be closed with stitches (sutures), and a bandage may be placed over the incisions. AFTER THE PROCEDURE   You will rest in a recovery room for 1 4 hours until you are stable and doing well.  You will also have some mild abdominal discomfort for 3 7 days. You will be given pain medicine to ease any discomfort.  As long as there are no problems, you may be allowed to go home. Someone will need to drive you home and be with you for at least 24 hours once home.  You may have some mild discomfort in the throat. This is from the tube placed in your throat while you were sleeping.  You may experience discomfort in the shoulder area from some trapped air between the liver and diaphragm. This sensation is normal and will slowly go away on its own. Document Released: 02/21/2001 Document Revised: 05/16/2012 Document Reviewed: 02/26/2012 [facility] Patient Information 2013 [place], [place]. PATIENT INSTRUCTIONS POST-ANESTHESIA  IMMEDIATELY FOLLOWING SURGERY:  Do not drive or operate machinery for the first twenty four hours after surgery.  Do not make any important decisions for twenty four hours after surgery or while taking narcotic pain medications or sedatives.  If you develop intractable nausea and vomiting or a severe headache please notify your doctor immediately.  FOLLOW-UP:  Please make an appointment with your surgeon as instructed. You do not need to follow up with anesthesia unless specifically instructed to do so.  WOUND CARE INSTRUCTIONS (if applicable):  Keep a dry clean dressing on the anesthesia/puncture wound site if there is drainage.  Once the wound has quit draining you may leave it open to air.  Generally you should leave the bandage intact for twenty four hours unless there is drainage.  If the epidural site drains for more than 36-48 hours please call the anesthesia department.  QUESTIONS?:  Please feel free to call your physician or the hospital operator if you have any questions, and  they will be happy to assist you.

## 2012-11-08 ENCOUNTER — Ambulatory Visit (HOSPITAL_COMMUNITY)
Admission: RE | Admit: 2012-11-08 | Payer: Medicaid Other | Source: Ambulatory Visit | Admitting: Obstetrics & Gynecology

## 2012-11-08 ENCOUNTER — Encounter (HOSPITAL_COMMUNITY): Admission: RE | Payer: Self-pay | Source: Ambulatory Visit

## 2012-11-08 SURGERY — LIGATION, FALLOPIAN TUBE, LAPAROSCOPIC
Anesthesia: General | Laterality: Bilateral

## 2013-02-12 ENCOUNTER — Other Ambulatory Visit: Payer: Self-pay | Admitting: Obstetrics and Gynecology

## 2013-02-12 ENCOUNTER — Encounter (HOSPITAL_COMMUNITY): Payer: Self-pay | Admitting: Obstetrics and Gynecology

## 2013-02-12 DIAGNOSIS — Z3009 Encounter for other general counseling and advice on contraception: Secondary | ICD-10-CM

## 2013-02-13 ENCOUNTER — Inpatient Hospital Stay (HOSPITAL_COMMUNITY): Admission: RE | Admit: 2013-02-13 | Payer: Medicaid Other | Source: Ambulatory Visit

## 2013-02-15 ENCOUNTER — Encounter (HOSPITAL_COMMUNITY)
Admission: RE | Admit: 2013-02-15 | Discharge: 2013-02-15 | Disposition: A | Discharge status: Home / Self Care | Payer: Medicaid Other | Source: Ambulatory Visit

## 2013-02-15 NOTE — Patient Instructions (Addendum)
Erika Mcdonald  02/15/2013   Your procedure is scheduled on:  02/20/2013  Report to [facility] at 730 AM.  Call this number if you have problems the morning of surgery: [phone]   Remember:   Do not eat food or drink liquids after midnight.   Take these medicines the morning of surgery with A SIP OF WATER: percocet, use Albuterol inhaler   Do not wear jewelry, make-up or nail polish.  Do not wear lotions, powders, or perfumes. You may wear deodorant.  Do not shave 48 hours prior to surgery. Men may shave face and neck.  Do not bring valuables to the hospital.  Contacts, dentures or bridgework may not be worn into surgery.  Leave suitcase in the car. After surgery it may be brought to your room.  For patients admitted to the hospital, checkout time is 11:00 AM the day of  discharge.   Patients discharged the day of surgery will not be allowed to drive  home.  Name and phone number of your driver:  Special Instructions: Shower using CHG 2 nights before surgery and the night before surgery.  If you shower the day of surgery use CHG.  Use special wash - you have one bottle of CHG for all showers.  You should use approximately 1/3 of the bottle for each shower.   Please read over the following fact sheets that you were given: Pain Booklet, MRSA Information and Anesthesia Post-op Instructions Laparoscopic Tubal Ligation Care After Refer to this sheet in the next few weeks. These instructions provide you with information on caring for yourself after your procedure. Your caregiver may also give you more specific instructions. Your treatment has been planned according to current medical practices, but problems sometimes occur. Call your caregiver if you have any problems or questions after your procedure. HOME CARE INSTRUCTIONS   Rest the remainder of the day.  Only take over-the-counter or prescription medicines for pain, discomfort, or fever as directed by your caregiver. Do not take  aspirin. It can cause bleeding.  Gradually resume daily activities, diet, rest, driving, and work.  Avoid sexual intercourse for 2 weeks or as directed.  Do not use tampons or douche.  Do not drive while taking pain medicine.  Do not lift anything over 5 pounds for 2 weeks or as directed.  Only take showers, not baths, until you are seen by your caregiver.  Change bandages (dressings) as directed.  Take your temperature twice a day and record it.  Try to have help for the first 7 to 10 days for your household needs.  Return to your caregiver to get your stitches (sutures) removed and for follow-up visits as directed. SEEK MEDICAL CARE IF:   You have redness, swelling, or increasing pain in a wound.  You have drainage from a wound lasting longer than 1 day.  Your pain is getting worse.  You have a rash.  You become dizzy or lightheaded.  You have a reaction to your medicine.  You need stronger medicine or a change in your pain medicine.  You notice a bad smell coming from a wound or dressing.  Your wound breaks open after the sutures have been removed.  You are constipated. SEEK IMMEDIATE MEDICAL CARE IF:   You faint.  You have a fever.  You have increasing abdominal pain.  You have severe pain in your shoulders.  You have bleeding or drainage from the suture sites or vagina following surgery.  You have shortness  of breath or difficulty breathing.  You have chest or leg pain.  You have persistent nausea, vomiting, or diarrhea. MAKE SURE YOU:   Understand these instructions.  Watch your condition.  Get help right away if you are not doing well or get worse. Document Released: 06/04/2005 Document Revised: 05/16/2012 Document Reviewed: 02/26/2012 [facility] Patient Information 2013 [place], [place].  PATIENT INSTRUCTIONS POST-ANESTHESIA  IMMEDIATELY FOLLOWING SURGERY:  Do not drive or operate machinery for the first twenty four hours after surgery.  Do  not make any important decisions for twenty four hours after surgery or while taking narcotic pain medications or sedatives.  If you develop intractable nausea and vomiting or a severe headache please notify your doctor immediately.  FOLLOW-UP:  Please make an appointment with your surgeon as instructed. You do not need to follow up with anesthesia unless specifically instructed to do so.  WOUND CARE INSTRUCTIONS (if applicable):  Keep a dry clean dressing on the anesthesia/puncture wound site if there is drainage.  Once the wound has quit draining you may leave it open to air.  Generally you should leave the bandage intact for twenty four hours unless there is drainage.  If the epidural site drains for more than 36-48 hours please call the anesthesia department.  QUESTIONS?:  Please feel free to call your physician or the hospital operator if you have any questions, and they will be happy to assist you.

## 2013-02-16 ENCOUNTER — Encounter (HOSPITAL_COMMUNITY)
Admission: RE | Admit: 2013-02-16 | Discharge: 2013-02-16 | Disposition: A | Discharge status: Home / Self Care | Payer: Medicaid Other | Source: Ambulatory Visit | Attending: Obstetrics and Gynecology | Admitting: Obstetrics and Gynecology

## 2013-02-16 ENCOUNTER — Encounter (HOSPITAL_COMMUNITY): Payer: Self-pay

## 2013-02-16 ENCOUNTER — Encounter (HOSPITAL_COMMUNITY): Payer: Self-pay | Admitting: Pharmacy Technician

## 2013-02-16 LAB — URINALYSIS, ROUTINE W REFLEX MICROSCOPIC
Bilirubin Urine: NEGATIVE
Hgb urine dipstick: NEGATIVE
Protein, ur: NEGATIVE mg/dL
Urobilinogen, UA: 0.2 mg/dL (ref 0.0–1.0)

## 2013-02-16 LAB — CBC
MCH: 30 pg (ref 26.0–34.0)
MCV: 89.6 fL (ref 78.0–100.0)
Platelets: 202 10*3/uL (ref 150–400)
RDW: 12.9 % (ref 11.5–15.5)

## 2013-02-19 ENCOUNTER — Encounter (HOSPITAL_COMMUNITY): Payer: Self-pay | Admitting: Obstetrics and Gynecology

## 2013-02-19 NOTE — H&P (Signed)
Erika Mcdonald is an 27 y.o. female. F2 she is admitted for tubal sterilization via removal of fallopian tubes., Converted from Falope ring application in order to reduce the risk of future tubal disease, including the risk of tubal cancer. Procedure is been reviewed with patient including as and cons of salpingectomy instead of simply placing Falope-Rings.  Pertinent Gynecological History: Menses: flow is moderate Bleeding:  Contraception: abstinence DES exposure: unknown Blood transfusions: none Sexually transmitted diseases: no past history Previous GYN Procedures:   Last mammogram:  Date:  Last pap: normal Date: 05/09/2012 OB History: G4, P4   Menstrual History: Menarche age:  No LMP recorded.    Past Medical History  Diagnosis Date  . Anxiety   . Chronic back pain     prior pt of [place],   . Fibromyalgia   . 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 She complained of back pain that is worse with activity, worse since having her last baby. She alleges that she said 3 slipped discs, and is in the past been managed by Dr. Gerilyn Pilgrim. She has a prior history of being worked out and cared for byDr.[place] (?sp),and has been on as much as 180 percocet 10 per month. Currently using less than 120 /month  Past Surgical History  Procedure Laterality Date  . Leep    Dr Gilford Silvius prior to pregnancy  No family history on file.  Social History:  reports that she has been smoking Cigarettes.  She has a 15 pack-year smoking history. She does not have any smokeless tobacco history on file. She reports that she uses illicit drugs (Oxycodone). She reports that she does not drink alcohol.  Allergies:  Allergies  Allergen Reactions  . Bee Venom Anaphylaxis and Swelling  . Ciprofloxacin Shortness Of Breath  . Levetiracetam Other (See Comments)    Paradoxical;Triggers seizures  . Sulfa Antibiotics Shortness Of  Breath  . Neurontin (Gabapentin) Other (See Comments)    Unknown  . Robaxin (Methocarbamol) Other (See Comments)    Triggers Seizures   . Ultram (Tramadol Hcl) Swelling    No prescriptions prior to admission    ROSbenighn   unknown if currently breastfeeding. Physical Exam Physical Examination: General appearance - alert, well appearing, and in no distress, oriented to person, place, and time, normal appearing weight . Mental status - alert, oriented to person, place, and time, normal mood, behavior, speech, dress, motor activity, and thought processes Eyes - pupils equal and reactive, extraocular eye movements intact, sclera anicteric, no cataracts noted,  Chest - no tachypnea, retractions or cyanosis Heart - normal rate and regular rhythm Pelvic - normal external genitalia, vulva, vagina, cervix, uterus and adnexa  No results found for this or any previous visit (from the past 24 hour(s)).  No results found.  Assessment/Plan: Desire for permanent sterlilization Plans for Bilateral salpinectomy   Taleigh Gero V 02/19/2013, 11:24 PM

## 2013-02-20 ENCOUNTER — Encounter (HOSPITAL_COMMUNITY): Payer: Self-pay | Admitting: *Deleted

## 2013-02-20 ENCOUNTER — Ambulatory Visit (HOSPITAL_COMMUNITY)
Admission: RE | Admit: 2013-02-20 | Discharge: 2013-02-20 | Disposition: A | Discharge status: Home / Self Care | Payer: Medicaid Other | Source: Ambulatory Visit | Attending: Obstetrics and Gynecology | Admitting: Obstetrics and Gynecology

## 2013-02-20 ENCOUNTER — Encounter (HOSPITAL_COMMUNITY)
Admission: RE | Disposition: A | Discharge status: Home / Self Care | Payer: Self-pay | Source: Ambulatory Visit | Attending: Obstetrics and Gynecology

## 2013-02-20 ENCOUNTER — Encounter (HOSPITAL_COMMUNITY): Payer: Self-pay | Admitting: Anesthesiology

## 2013-02-20 ENCOUNTER — Ambulatory Visit (HOSPITAL_COMMUNITY): Payer: Medicaid Other | Admitting: Anesthesiology

## 2013-02-20 DIAGNOSIS — N946 Dysmenorrhea, unspecified: Secondary | ICD-10-CM

## 2013-02-20 DIAGNOSIS — Z302 Encounter for sterilization: Secondary | ICD-10-CM | POA: Insufficient documentation

## 2013-02-20 DIAGNOSIS — Z3009 Encounter for other general counseling and advice on contraception: Secondary | ICD-10-CM

## 2013-02-20 DIAGNOSIS — N920 Excessive and frequent menstruation with regular cycle: Secondary | ICD-10-CM

## 2013-02-20 DIAGNOSIS — Z01812 Encounter for preprocedural laboratory examination: Secondary | ICD-10-CM | POA: Insufficient documentation

## 2013-02-20 HISTORY — PX: LAPAROSCOPIC BILATERAL SALPINGECTOMY: SHX5889

## 2013-02-20 HISTORY — DX: Migraine, unspecified, not intractable, without status migrainosus: G43.909

## 2013-02-20 HISTORY — PX: ENDOMETRIAL ABLATION: SHX621

## 2013-02-20 SURGERY — SALPINGECTOMY, BILATERAL, LAPAROSCOPIC
Anesthesia: General | Site: Vagina | Wound class: Clean Contaminated

## 2013-02-20 MED ORDER — BUPIVACAINE-EPINEPHRINE PF 0.5-1:200000 % IJ SOLN
INTRAMUSCULAR | Status: AC
  Filled 2013-02-20: qty 10

## 2013-02-20 MED ORDER — DEXTROSE 5 % IV SOLN
INTRAVENOUS | Status: DC | PRN
  Administered 2013-02-20: 500 mL via INTRAVENOUS

## 2013-02-20 MED ORDER — FENTANYL CITRATE 0.05 MG/ML IJ SOLN
INTRAMUSCULAR | Status: DC | PRN
  Administered 2013-02-20: 100 ug via INTRAVENOUS
  Administered 2013-02-20 (×3): 50 ug via INTRAVENOUS

## 2013-02-20 MED ORDER — FENTANYL CITRATE 0.05 MG/ML IJ SOLN
INTRAMUSCULAR | Status: AC
  Filled 2013-02-20: qty 5

## 2013-02-20 MED ORDER — KETOROLAC TROMETHAMINE 30 MG/ML IJ SOLN
INTRAMUSCULAR | Status: AC
  Filled 2013-02-20: qty 1

## 2013-02-20 MED ORDER — ONDANSETRON HCL 4 MG/2ML IJ SOLN: 4.0000 mg | Freq: Once | INTRAMUSCULAR | Status: DC | PRN

## 2013-02-20 MED ORDER — ROCURONIUM BROMIDE 50 MG/5ML IV SOLN
INTRAVENOUS | Status: AC
  Filled 2013-02-20: qty 1

## 2013-02-20 MED ORDER — MIDAZOLAM HCL 2 MG/2ML IJ SOLN
INTRAMUSCULAR | Status: AC
  Filled 2013-02-20: qty 2

## 2013-02-20 MED ORDER — MIDAZOLAM HCL 2 MG/2ML IJ SOLN
2.0000 mg | Freq: Once | INTRAMUSCULAR | Status: AC
  Administered 2013-02-20: 2 mg via INTRAVENOUS

## 2013-02-20 MED ORDER — BUPIVACAINE HCL (PF) 0.25 % IJ SOLN
INTRAMUSCULAR | Status: AC
  Filled 2013-02-20: qty 30

## 2013-02-20 MED ORDER — FENTANYL CITRATE 0.05 MG/ML IJ SOLN
INTRAMUSCULAR | Status: AC
  Filled 2013-02-20: qty 2

## 2013-02-20 MED ORDER — NEOSTIGMINE METHYLSULFATE 1 MG/ML IJ SOLN
INTRAMUSCULAR | Status: DC | PRN
  Administered 2013-02-20: 2 mg via INTRAVENOUS

## 2013-02-20 MED ORDER — KETOROLAC TROMETHAMINE 10 MG PO TABS: 10.0000 mg | ORAL_TABLET | Freq: Four times a day (QID) | ORAL | Status: DC | PRN

## 2013-02-20 MED ORDER — ONDANSETRON HCL 4 MG/2ML IJ SOLN
4.0000 mg | Freq: Once | INTRAMUSCULAR | Status: AC
  Administered 2013-02-20: 4 mg via INTRAVENOUS

## 2013-02-20 MED ORDER — LACTATED RINGERS IV SOLN
INTRAVENOUS | Status: DC
  Administered 2013-02-20 (×2): via INTRAVENOUS

## 2013-02-20 MED ORDER — SODIUM CHLORIDE 0.9 % IR SOLN
Status: DC | PRN
  Administered 2013-02-20: 3000 mL

## 2013-02-20 MED ORDER — ROCURONIUM BROMIDE 100 MG/10ML IV SOLN
INTRAVENOUS | Status: DC | PRN
  Administered 2013-02-20: 5 mg via INTRAVENOUS
  Administered 2013-02-20: 25 mg via INTRAVENOUS

## 2013-02-20 MED ORDER — CEFAZOLIN SODIUM-DEXTROSE 2-3 GM-% IV SOLR
INTRAVENOUS | Status: AC
  Filled 2013-02-20: qty 50

## 2013-02-20 MED ORDER — 0.9 % SODIUM CHLORIDE (POUR BTL) OPTIME
TOPICAL | Status: DC | PRN
  Administered 2013-02-20: 1000 mL

## 2013-02-20 MED ORDER — LIDOCAINE HCL (CARDIAC) 10 MG/ML IV SOLN
INTRAVENOUS | Status: DC | PRN
  Administered 2013-02-20: 20 mg via INTRAVENOUS

## 2013-02-20 MED ORDER — OXYCODONE-ACETAMINOPHEN 10-325 MG PO TABS: 1.0000 | ORAL_TABLET | Freq: Four times a day (QID) | ORAL | Status: DC | PRN

## 2013-02-20 MED ORDER — GLYCOPYRROLATE 0.2 MG/ML IJ SOLN
INTRAMUSCULAR | Status: DC | PRN
  Administered 2013-02-20: 0.4 mg via INTRAVENOUS

## 2013-02-20 MED ORDER — BUPIVACAINE-EPINEPHRINE 0.5% -1:200000 IJ SOLN
INTRAMUSCULAR | Status: DC | PRN
  Administered 2013-02-20: 20 mL

## 2013-02-20 MED ORDER — LIDOCAINE HCL (PF) 1 % IJ SOLN
INTRAMUSCULAR | Status: AC
  Filled 2013-02-20: qty 5

## 2013-02-20 MED ORDER — MIDAZOLAM HCL 2 MG/2ML IJ SOLN
1.0000 mg | INTRAMUSCULAR | Status: DC | PRN
  Administered 2013-02-20 (×2): 2 mg via INTRAVENOUS

## 2013-02-20 MED ORDER — PROPOFOL 10 MG/ML IV EMUL
INTRAVENOUS | Status: AC
  Filled 2013-02-20: qty 20

## 2013-02-20 MED ORDER — KETOROLAC TROMETHAMINE 30 MG/ML IJ SOLN
30.0000 mg | Freq: Once | INTRAMUSCULAR | Status: AC
  Administered 2013-02-20: 30 mg via INTRAVENOUS

## 2013-02-20 MED ORDER — PROPOFOL 10 MG/ML IV BOLUS
INTRAVENOUS | Status: DC | PRN
  Administered 2013-02-20: 20 mg via INTRAVENOUS
  Administered 2013-02-20: 150 mg via INTRAVENOUS

## 2013-02-20 MED ORDER — GLYCOPYRROLATE 0.2 MG/ML IJ SOLN
INTRAMUSCULAR | Status: AC
  Filled 2013-02-20: qty 2

## 2013-02-20 MED ORDER — FENTANYL CITRATE 0.05 MG/ML IJ SOLN
25.0000 ug | INTRAMUSCULAR | Status: DC | PRN
  Administered 2013-02-20 (×4): 50 ug via INTRAVENOUS

## 2013-02-20 SURGICAL SUPPLY — 46 items
APPLIER CLIP UNV 5X34 EPIX (ENDOMECHANICALS) ×2 IMPLANT
APR XCLPCLP 20M/L UNV 34X5 (ENDOMECHANICALS) ×3
BAG HAMPER (MISCELLANEOUS) ×4 IMPLANT
BLADE SURG SZ11 CARB STEEL (BLADE) ×4 IMPLANT
CATH ROBINSON RED A/P 16FR (CATHETERS) ×4 IMPLANT
CATH THERMACHOICE III (CATHETERS) ×2 IMPLANT
CLOTH BEACON ORANGE TIMEOUT ST (SAFETY) ×4 IMPLANT
COVER LIGHT HANDLE STERIS (MISCELLANEOUS) ×8 IMPLANT
COVER MAYO STAND XLG (DRAPE) ×4 IMPLANT
DECANTER SPIKE VIAL GLASS SM (MISCELLANEOUS) ×2 IMPLANT
DRESSING COVERLET 3X1 FLEXIBLE (GAUZE/BANDAGES/DRESSINGS) ×10 IMPLANT
ELECT REM PT RETURN 9FT ADLT (ELECTROSURGICAL) ×4
ELECTRODE REM PT RTRN 9FT ADLT (ELECTROSURGICAL) ×3 IMPLANT
GLOVE BIOGEL PI IND STRL 7.0 (GLOVE) ×2 IMPLANT
GLOVE BIOGEL PI INDICATOR 7.0 (GLOVE) ×2
GLOVE ECLIPSE 6.5 STRL STRAW (GLOVE) ×2 IMPLANT
GLOVE ECLIPSE 9.0 STRL (GLOVE) ×4 IMPLANT
GLOVE EXAM NITRILE MD LF STRL (GLOVE) ×4 IMPLANT
GLOVE INDICATOR STER SZ 9 (GLOVE) ×4 IMPLANT
GLOVE SS BIOGEL STRL SZ 6.5 (GLOVE) ×1 IMPLANT
GLOVE SUPERSENSE BIOGEL SZ 6.5 (GLOVE) ×1
GOWN STRL REIN 3XL LVL4 (GOWN DISPOSABLE) ×4 IMPLANT
GOWN STRL REIN XL XLG (GOWN DISPOSABLE) ×6 IMPLANT
INST SET LAPROSCOPIC GYN AP (KITS) ×4 IMPLANT
IV NS IRRIG 3000ML ARTHROMATIC (IV SOLUTION) ×2 IMPLANT
KIT ROOM TURNOVER AP CYSTO (KITS) ×4 IMPLANT
MANIFOLD NEPTUNE II (INSTRUMENTS) ×4 IMPLANT
NDL INSUFFLATION 14GA 120MM (NEEDLE) IMPLANT
NEEDLE INSUFFLATION 14GA 120MM (NEEDLE) ×4 IMPLANT
NS IRRIG 1000ML POUR BTL (IV SOLUTION) ×4 IMPLANT
PACK PERI GYN (CUSTOM PROCEDURE TRAY) ×4 IMPLANT
PAD ARMBOARD 7.5X6 YLW CONV (MISCELLANEOUS) ×4 IMPLANT
PAD TELFA 3X4 1S STER (GAUZE/BANDAGES/DRESSINGS) ×2 IMPLANT
SET BASIN LINEN APH (SET/KITS/TRAYS/PACK) ×4 IMPLANT
SLEEVE Z-THREAD 5X100MM (TROCAR) ×2 IMPLANT
SOL PREP PROV IODINE SCRUB 4OZ (MISCELLANEOUS) ×2 IMPLANT
SOLUTION ANTI FOG 6CC (MISCELLANEOUS) ×4 IMPLANT
STRIP CLOSURE SKIN 1/4X3 (GAUZE/BANDAGES/DRESSINGS) ×4 IMPLANT
SUT VIC AB 4-0 PS2 27 (SUTURE) ×2 IMPLANT
SYR BULB IRRIGATION 50ML (SYRINGE) ×4 IMPLANT
SYR CONTROL 10ML LL (SYRINGE) ×4 IMPLANT
SYRINGE 10CC LL (SYRINGE) ×4 IMPLANT
TROCAR KII 8X100ML NONTHREADED (TROCAR) ×4 IMPLANT
TROCAR Z-THREAD FIOS 5X100MM (TROCAR) ×4 IMPLANT
TUBING INSUFFLATION HIGH FLOW (TUBING) ×6 IMPLANT
WARMER LAPAROSCOPE (MISCELLANEOUS) ×4 IMPLANT

## 2013-02-20 NOTE — Interval H&P Note (Signed)
History and Physical Interval Note:  02/20/2013 12:07 PM  Erika Mcdonald  has presented today for surgery, with the diagnosis of sterilization request  The various methods of treatment have been discussed with the patient and family. After consideration of risks, benefits and other options for treatment, the patient has consented to  Procedure(s): LAPAROSCOPIC TUBAL LIGATION with Falope Rings (Bilateral) and also endometrial ablation.  Pt requested the addition of ablation as a surgical intervention . She had previously received info regarding ablation in the office. .  The patient's history has been reviewed, patient examined, no change in status, stable for surgery.  I have reviewed the patient's chart and labs.  Questions were answered to the patient's satisfaction.  Serum pregnancy test is negative.   Tilda Burrow

## 2013-02-20 NOTE — Anesthesia Postprocedure Evaluation (Signed)
  Anesthesia Post-op Note  Patient: Erika Mcdonald  Procedure(s) Performed: Procedure(s) with comments: LAPAROSCOPIC BILATERAL SALPINGECTOMY (Bilateral) ENDOMETRIAL ABLATION (N/A) - temp 86-88 degrees C; total in 9cc D5W, total out; total time = 8 minutes 49 seconds  Patient Location: PACU  Anesthesia Type:General  Level of Consciousness: awake, oriented and patient cooperative  Airway and Oxygen Therapy: Patient Spontanous Breathing  Post-op Pain: moderate  Post-op Assessment: Post-op Vital signs reviewed, Patient's Cardiovascular Status Stable, Respiratory Function Stable, Patent Airway and No signs of Nausea or vomiting  Post-op Vital Signs: Reviewed and stable  Complications: No apparent anesthesia complications

## 2013-02-20 NOTE — OR Nursing (Signed)
Up to bathroom to void 

## 2013-02-20 NOTE — Brief Op Note (Signed)
02/20/2013  2:59 PM  PATIENT:  Erika Mcdonald  27 y.o. female  PRE-OPERATIVE DIAGNOSIS:  sterilization request, menorhagia, dysmenorrhea  POST-OPERATIVE DIAGNOSIS:  sterilization request, menorrhagia, dysmenorrhea  PROCEDURE:  Procedure(s) with comments: LAPAROSCOPIC BILATERAL SALPINGECTOMY (Bilateral) ENDOMETRIAL ABLATION (N/A) - temp 86-88 degrees C; total in 9cc D5W, total out; total time = 8 minutes 49 seconds  SURGEON:  Surgeon(s) and Role:    * Tilda Burrow, MD - Primary  PHYSICIAN ASSISTANT:   ASSISTANTS: none   ANESTHESIA:   local and general  EBL:  Total I/O In: 1000 [I.V.:1000] Out: 120 [Urine:100; Blood:20]  BLOOD ADMINISTERED:none  DRAINS: none   LOCAL MEDICATIONS USED:  MARCAINE    and Amount: 20 ml  SPECIMEN:  Source of Specimen:  tubes  DISPOSITION OF SPECIMEN:  PATHOLOGY  COUNTS:  YES  TOURNIQUET:  * No tourniquets in log *  DICTATION: .Dragon Dictation  PLAN OF CARE: Discharge to home after PACU  PATIENT DISPOSITION:  PACU - hemodynamically stable.   Delay start of Pharmacological VTE agent (>24hrs) due to surgical blood loss or risk of bleeding: yes

## 2013-02-20 NOTE — Transfer of Care (Signed)
Immediate Anesthesia Transfer of Care Note  Patient: Erika Mcdonald  Procedure(s) Performed: Procedure(s) (LRB): LAPAROSCOPIC BILATERAL SALPINGECTOMY (Bilateral) ENDOMETRIAL ABLATION (N/A)  Patient Location: PACU  Anesthesia Type: General  Level of Consciousness: awake  Airway & Oxygen Therapy: Patient Spontanous Breathing and non-rebreather face mask  Post-op Assessment: Report given to PACU RN, Post -op Vital signs reviewed and stable and Patient moving all extremities  Post vital signs: Reviewed and stable  Complications: No apparent anesthesia complications

## 2013-02-20 NOTE — Anesthesia Preprocedure Evaluation (Signed)
Anesthesia Evaluation  Patient identified by MRN, date of birth, ID band Patient awake    Reviewed: Allergy & Precautions, H&P , NPO status , Patient's Chart, lab work & pertinent test results  Airway Mallampati: I TM Distance: >3 FB     Dental  (+) Teeth Intact   Pulmonary asthma , Current Smoker,  breath sounds clear to auscultation        Cardiovascular negative cardio ROS  Rhythm:Regular Rate:Normal     Neuro/Psych  Headaches, Anxiety    GI/Hepatic   Endo/Other    Renal/GU      Musculoskeletal  (+) Fibromyalgia -  Abdominal   Peds  Hematology   Anesthesia Other Findings   Reproductive/Obstetrics                           Anesthesia Physical Anesthesia Plan  ASA: II  Anesthesia Plan: General   Post-op Pain Management:    Induction: Intravenous  Airway Management Planned: Oral ETT  Additional Equipment:   Intra-op Plan:   Post-operative Plan: Extubation in OR  Informed Consent: I have reviewed the patients History and Physical, chart, labs and discussed the procedure including the risks, benefits and alternatives for the proposed anesthesia with the patient or authorized representative who has indicated his/her understanding and acceptance.     Plan Discussed with:   Anesthesia Plan Comments:         Anesthesia Quick Evaluation

## 2013-02-20 NOTE — Interval H&P Note (Signed)
History and Physical Interval Note:  02/20/2013 8:46 AM  Erika Mcdonald  has presented today for surgery, with the diagnosis of sterilization request  The various methods of treatment have been discussed with the patient and family. After consideration of risks, benefits and other options for treatment, the patient has consented to  Procedure(s): LAPAROSCOPIC TUBAL LIGATION with Falope Rings (Bilateral) as a surgical intervention .  The patient's history has been reviewed, patient examined, no change in status, stable for surgery.  I have reviewed the patient's chart and labs.  Questions were answered to the patient's satisfaction.     Tilda Burrow

## 2013-02-20 NOTE — Anesthesia Procedure Notes (Signed)
Procedure Name: Intubation Date/Time: 02/20/2013 1:49 PM Performed by: Franco Nones Pre-anesthesia Checklist: Patient identified, Patient being monitored, Timeout performed, Emergency Drugs available and Suction available Patient Re-evaluated:Patient Re-evaluated prior to inductionOxygen Delivery Method: Circle System Utilized Preoxygenation: Pre-oxygenation with 100% oxygen Intubation Type: IV induction Ventilation: Mask ventilation without difficulty Laryngoscope Size: Mac and 3 Grade View: Grade I Tube type: Oral Tube size: 7.0 mm Number of attempts: 1 Airway Equipment and Method: stylet Placement Confirmation: ETT inserted through vocal cords under direct vision,  positive ETCO2 and breath sounds checked- equal and bilateral Secured at: 21 cm Tube secured with: Tape Dental Injury: Teeth and Oropharynx as per pre-operative assessment

## 2013-02-20 NOTE — Op Note (Signed)
The patient was taken to the operating room prepped and draped for vaginal and abdominal procedure legs in low lithotomy support. An infraumbilical vertical skin incision was made in timeout conducted and surgical procedure confirmed by surgical team. Veress needle achieved pneumoperitoneum under 9 mm mercury pressure, and then laparoscopic trocar introduced under direct visualization in the suprapubic 8 mm trocar placed as well. A third right lower quadrant trocar was inserted. Attention was directed to the right fallopian tube which was grasped in its fimbriated end, elevated and unipolar hook cautery use to amputate the right tube and extracted through the suprapubic site without difficulty. On the left side the tube was elevated and unipolar cautery used in similar fashion we had a lot of bleeding from the cornual area of the tube and this required placement of several hemoclips to acquire hemostasis. Hemostasis was then satisfactory and tube was amputated off and careful to stay well away from the bowel throughout the procedure. Deflation of abdomen was 120 cc in the abdomen was followed by subcuticular for closure of all skin sites.  Endometrial ablation. Ablation was performed by moving to the vaginal area placing speculum, grasping the cervix with single-tooth tenaculum sounding the cervix to 7 cm, dilated to 23 [place], introducing the Gynecare ThermaChoice 3 endometrial ablation device and completing the thermal ablation sequence after hysteroscopy had been performed to confirm a normal endometrial cavity. Deflation of the balloon the D5W and recovery of all 9 cc of D5W used for the procedure was then performed, and the patient went to recovery in good condition. There was a paracervical block with 20 cc of Marcaine with epinephrine injected prior to onset of procedure. Sponge and needle counts correct condition to recovery in stable isn't anticipated the patient will have greater than normal discomfort  due to her low pain threshold chronic habituation to opiate use.

## 2013-02-22 ENCOUNTER — Encounter (HOSPITAL_COMMUNITY): Payer: Self-pay | Admitting: Obstetrics and Gynecology

## 2013-03-02 ENCOUNTER — Encounter: Payer: Self-pay | Admitting: Obstetrics and Gynecology

## 2013-03-02 ENCOUNTER — Ambulatory Visit (INDEPENDENT_AMBULATORY_CARE_PROVIDER_SITE_OTHER): Payer: Medicaid Other | Admitting: Obstetrics and Gynecology

## 2013-03-02 VITALS — BP 120/60 | Wt 143.8 lb

## 2013-03-02 DIAGNOSIS — G8929 Other chronic pain: Secondary | ICD-10-CM | POA: Insufficient documentation

## 2013-03-02 DIAGNOSIS — N949 Unspecified condition associated with female genital organs and menstrual cycle: Secondary | ICD-10-CM

## 2013-03-02 DIAGNOSIS — M79609 Pain in unspecified limb: Secondary | ICD-10-CM

## 2013-03-02 DIAGNOSIS — M545 Low back pain: Secondary | ICD-10-CM

## 2013-03-02 MED ORDER — OXYCODONE-ACETAMINOPHEN 10-325 MG PO TABS: 1.0000 | ORAL_TABLET | Freq: Four times a day (QID) | ORAL | Status: DC | PRN

## 2013-03-02 MED ORDER — IBUPROFEN 800 MG PO TABS: 800.0000 mg | ORAL_TABLET | Freq: Three times a day (TID) | ORAL | Status: DC | PRN

## 2013-03-02 NOTE — Progress Notes (Signed)
  Assessment:  POSTOP VISIT   Plan:  ROUTINE POSTOP CARE CONTINUE PAIN MEDS TIL followup at pain clinic may ist   Subjective:  Erika Mcdonald is a 27 y.o. female, [id number], who presents for postop check.  The following portions of the patient's history were reviewed and updated as appropriate: allergies, current medications, past medical & surgical history, & past family history.       Review of Systems Pharge or nipple retraction Gastrointestinal: Negative for abdominal pain, change in bowel habits or rectal bleeding GU: Negative for dysuria, frequency, urgency or incontinence.   GYN: Patient's last menstrual period was 01/02/2013.   Objective:  BP 120/60  Wt 143 lb 12.8 oz (65.227 kg)  BMI 25.48 kg/m2  LMP 01/02/2013  Breastfeeding? No    BMI: Body mass index is 25.48 kg/(m^2).  General Ap Gastrointestinal: Soft, non-tender, no masses or organomegaly Pelvic Exam: Vaginal: normal mucosa without prolapse or lesions Cervix: clear mucus no purulence Uterus: mid-position Rectovaginal: not indicated  Christin Bach MD

## 2013-03-05 ENCOUNTER — Telehealth: Payer: Self-pay | Admitting: Obstetrics and Gynecology

## 2013-03-05 NOTE — Telephone Encounter (Signed)
Per Dr. Emelda Fear, Will not call pharmacy for pt to get pain medication RX filled early.

## 2013-04-06 ENCOUNTER — Encounter: Payer: Self-pay | Admitting: *Deleted

## 2013-04-09 ENCOUNTER — Encounter: Payer: Self-pay | Admitting: Obstetrics and Gynecology

## 2013-04-09 ENCOUNTER — Ambulatory Visit (INDEPENDENT_AMBULATORY_CARE_PROVIDER_SITE_OTHER): Payer: Medicaid Other | Admitting: Obstetrics and Gynecology

## 2013-04-09 VITALS — BP 138/84 | Ht 64.0 in | Wt 145.2 lb

## 2013-04-09 DIAGNOSIS — M545 Low back pain: Secondary | ICD-10-CM

## 2013-04-09 DIAGNOSIS — N946 Dysmenorrhea, unspecified: Secondary | ICD-10-CM | POA: Insufficient documentation

## 2013-04-09 DIAGNOSIS — G8929 Other chronic pain: Secondary | ICD-10-CM

## 2013-04-09 MED ORDER — OXYCODONE-ACETAMINOPHEN 10-325 MG PO TABS: 1.0000 | ORAL_TABLET | Freq: Four times a day (QID) | ORAL | Status: DC | PRN

## 2013-04-09 NOTE — Progress Notes (Signed)
BP 138/84  Ht 5\' 4"  (1.626 m)  Wt 145 lb 3.2 oz (65.862 kg)  BMI 24.91 kg/m2  LMP 04/01/2013 GENERAL: Well-developed, well-nourished female in no acute distress.  HEENT: Normocephalic, atraumatic. Sclerae anicteric.  NECK: Supple. Normal thyroid.  LUNGS: Clear to auscultation bilaterally.  HEART: Regular rate and rhythm. BREASTS: Symmetric in size. No masses, skin changes, nipple drainage, or lymphadenopathy. ABDOMEN: Soft, nontender, nondistended. No organomegaly. PELVIC: Normal external female genitalia. Vagina is pink and rugated.  Normal discharge. Normal cervix contour. Uterus is normal in size. No adnexal mass or tenderness.  EXTREMITIES: No cyanosis, clubbing, or edema, 2+ distal pulses. Assess:  Chronic pan med use  Overdependence P  unable to be seen yet at pain clinic  Rx : renew Perc 10 x60 tablet   Pt to get  To primary md ASAP to address referral to pain specialist;

## 2013-04-09 NOTE — Patient Instructions (Signed)
Pessary fitting 2 wk Get referral to [facility] I care

## 2013-04-25 ENCOUNTER — Ambulatory Visit: Payer: Medicaid Other | Admitting: Obstetrics and Gynecology

## 2013-04-26 ENCOUNTER — Telehealth: Payer: Self-pay | Admitting: Obstetrics and Gynecology

## 2013-04-26 NOTE — Telephone Encounter (Signed)
Erika Mcdonald calls once again having missed  a followup appointment, explaining "I just forgot" then turned claimed that she thought the appointment was today. One of these 2 segments was a lie, in my opinion. Patient was reminded that we clearly discussed her opiate addiction at her last appointment and I made it absolutely clear to her at that time that I would not be continuing to manage her dependency, that she would need to be referred to a pain clinic and she should seek a primary care physician. She states that she has gotten a primary care physician but "is waiting for her card to arrive." It is my recollection she has always had a card available, i.e. her Medicaid card. Patient informed that under no circumstances will I refill any further opiates for her by phone. Patient strongly advised to complete her referral to a pain clinic for management of her opiate dependency.

## 2013-05-01 ENCOUNTER — Ambulatory Visit (INDEPENDENT_AMBULATORY_CARE_PROVIDER_SITE_OTHER): Payer: Medicaid Other | Admitting: Obstetrics and Gynecology

## 2013-05-01 ENCOUNTER — Encounter: Payer: Self-pay | Admitting: Obstetrics and Gynecology

## 2013-05-01 VITALS — BP 128/78 | Ht 63.0 in | Wt 139.2 lb

## 2013-05-01 DIAGNOSIS — G8929 Other chronic pain: Secondary | ICD-10-CM

## 2013-05-01 DIAGNOSIS — M545 Low back pain: Secondary | ICD-10-CM

## 2013-05-01 MED ORDER — OXYCODONE-ACETAMINOPHEN 10-325 MG PO TABS: 1.0000 | ORAL_TABLET | Freq: Four times a day (QID) | ORAL | Status: DC | PRN

## 2013-05-01 MED ORDER — RING PESSARY MISC: 1.0000 | Status: DC | PRN

## 2013-05-01 NOTE — Progress Notes (Signed)
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 She acknowledges that she is dependent on opiates, and has" taking them for so long" she has been uncooperative with her care post delivery. She was to seek a primary care physician and then the referred to pain clinic for long-term management and she continues to postpone this process. It is made bluntly clear to her today that no additional analgesics will be administered from this office Purposes for fitting of a pessary today, and renewing her Percocet one final time until her appointment with Dr. Bradly Bienenstock, of [facility] Physical Examination: General appearance - alert, well appearing, and in no distress, oriented to person, place, and time, normal appearing weight and anxious Mental status - alert, oriented to person, place, and time Abdomen - soft, nontender, nondistended, no masses or organomegaly Pelvic - normal external genitalia, vulva, vagina, cervix, uterus and adnexa fitted with Ring pessary Milex #3, 2-1/2 inch, 64 mm with the patient allowed to walk around without for a period of time and be comfortable with the pessary in place  Plan: will order pessary Additionally patient initially patient has been made perfectly clear to her that we will never only one additional refill of Percocet 10/325 for 60 tablet she mostly make these last until her appointment with Dr. Rosetta Posner

## 2013-05-16 ENCOUNTER — Encounter: Payer: Medicaid Other | Admitting: Obstetrics and Gynecology

## 2013-05-27 ENCOUNTER — Encounter: Payer: Self-pay | Admitting: Obstetrics and Gynecology

## 2013-05-27 DIAGNOSIS — F1120 Opioid dependence, uncomplicated: Secondary | ICD-10-CM | POA: Insufficient documentation

## 2013-06-04 ENCOUNTER — Encounter: Payer: Medicaid Other | Admitting: Obstetrics and Gynecology

## 2013-06-05 ENCOUNTER — Encounter: Payer: Self-pay | Admitting: Obstetrics and Gynecology

## 2013-08-06 ENCOUNTER — Inpatient Hospital Stay (HOSPITAL_COMMUNITY)
Admission: EM | Admit: 2013-08-06 | Discharge: 2013-08-10 | DRG: 607 | Disposition: A | Discharge status: Home Health | Payer: Medicaid Other | Attending: Internal Medicine | Admitting: Internal Medicine

## 2013-08-06 ENCOUNTER — Encounter (HOSPITAL_COMMUNITY): Payer: Self-pay | Admitting: *Deleted

## 2013-08-06 DIAGNOSIS — R000 Tachycardia, unspecified: Secondary | ICD-10-CM

## 2013-08-06 DIAGNOSIS — M545 Low back pain, unspecified: Secondary | ICD-10-CM | POA: Diagnosis present

## 2013-08-06 DIAGNOSIS — R21 Rash and other nonspecific skin eruption: Secondary | ICD-10-CM

## 2013-08-06 DIAGNOSIS — G43909 Migraine, unspecified, not intractable, without status migrainosus: Secondary | ICD-10-CM | POA: Diagnosis present

## 2013-08-06 DIAGNOSIS — M25579 Pain in unspecified ankle and joints of unspecified foot: Secondary | ICD-10-CM

## 2013-08-06 DIAGNOSIS — E876 Hypokalemia: Secondary | ICD-10-CM

## 2013-08-06 DIAGNOSIS — G8929 Other chronic pain: Secondary | ICD-10-CM

## 2013-08-06 DIAGNOSIS — Z23 Encounter for immunization: Secondary | ICD-10-CM

## 2013-08-06 DIAGNOSIS — M791 Myalgia, unspecified site: Secondary | ICD-10-CM

## 2013-08-06 DIAGNOSIS — M2550 Pain in unspecified joint: Secondary | ICD-10-CM

## 2013-08-06 DIAGNOSIS — F411 Generalized anxiety disorder: Secondary | ICD-10-CM | POA: Diagnosis present

## 2013-08-06 DIAGNOSIS — Z833 Family history of diabetes mellitus: Secondary | ICD-10-CM

## 2013-08-06 DIAGNOSIS — F17200 Nicotine dependence, unspecified, uncomplicated: Secondary | ICD-10-CM | POA: Diagnosis present

## 2013-08-06 DIAGNOSIS — Z8249 Family history of ischemic heart disease and other diseases of the circulatory system: Secondary | ICD-10-CM

## 2013-08-06 DIAGNOSIS — IMO0001 Reserved for inherently not codable concepts without codable children: Secondary | ICD-10-CM | POA: Diagnosis present

## 2013-08-06 DIAGNOSIS — R509 Fever, unspecified: Secondary | ICD-10-CM

## 2013-08-06 LAB — URINALYSIS, ROUTINE W REFLEX MICROSCOPIC
Glucose, UA: NEGATIVE mg/dL
Specific Gravity, Urine: 1.025 (ref 1.005–1.030)

## 2013-08-06 LAB — CBC WITH DIFFERENTIAL/PLATELET
Eosinophils Relative: 0 % (ref 0–5)
HCT: 33.6 % — ABNORMAL LOW (ref 36.0–46.0)
Lymphocytes Relative: 17 % (ref 12–46)
Lymphs Abs: 1.2 10*3/uL (ref 0.7–4.0)
MCV: 81.8 fL (ref 78.0–100.0)
Monocytes Absolute: 0.2 10*3/uL (ref 0.1–1.0)
Platelets: 265 10*3/uL (ref 150–400)
RBC: 4.11 MIL/uL (ref 3.87–5.11)
WBC: 7 10*3/uL (ref 4.0–10.5)

## 2013-08-06 LAB — COMPREHENSIVE METABOLIC PANEL
ALT: 18 U/L (ref 0–35)
Alkaline Phosphatase: 83 U/L (ref 39–117)
CO2: 25 mEq/L (ref 19–32)
Calcium: 9.6 mg/dL (ref 8.4–10.5)
Glucose, Bld: 98 mg/dL (ref 70–99)
Sodium: 133 mEq/L — ABNORMAL LOW (ref 135–145)

## 2013-08-06 LAB — URINE MICROSCOPIC-ADD ON

## 2013-08-06 LAB — SEDIMENTATION RATE: Sed Rate: 105 mm/hr — ABNORMAL HIGH (ref 0–22)

## 2013-08-06 LAB — PREGNANCY, URINE: Preg Test, Ur: NEGATIVE

## 2013-08-06 MED ORDER — DOXYCYCLINE HYCLATE 100 MG PO TABS
100.0000 mg | ORAL_TABLET | Freq: Once | ORAL | Status: AC
  Administered 2013-08-06: 100 mg via ORAL
  Filled 2013-08-06: qty 1

## 2013-08-06 MED ORDER — INFLUENZA VAC SPLIT QUAD 0.5 ML IM SUSP
0.5000 mL | INTRAMUSCULAR | Status: AC
  Administered 2013-08-07: 0.5 mL via INTRAMUSCULAR
  Filled 2013-08-06: qty 0.5

## 2013-08-06 MED ORDER — SODIUM CHLORIDE 0.9 % IJ SOLN: 3.0000 mL | INTRAMUSCULAR | Status: DC | PRN

## 2013-08-06 MED ORDER — ONDANSETRON HCL 4 MG/2ML IJ SOLN
4.0000 mg | Freq: Four times a day (QID) | INTRAMUSCULAR | Status: DC | PRN
  Administered 2013-08-07: 4 mg via INTRAVENOUS
  Filled 2013-08-06: qty 2

## 2013-08-06 MED ORDER — SODIUM CHLORIDE 0.9 % IV SOLN: 250.0000 mL | INTRAVENOUS | Status: DC | PRN

## 2013-08-06 MED ORDER — HYDROCODONE-ACETAMINOPHEN 5-325 MG PO TABS
1.0000 | ORAL_TABLET | ORAL | Status: DC | PRN
  Administered 2013-08-06: 2 via ORAL
  Filled 2013-08-06: qty 2

## 2013-08-06 MED ORDER — POTASSIUM CHLORIDE CRYS ER 20 MEQ PO TBCR
40.0000 meq | EXTENDED_RELEASE_TABLET | Freq: Once | ORAL | Status: AC
  Administered 2013-08-06: 40 meq via ORAL
  Filled 2013-08-06: qty 2

## 2013-08-06 MED ORDER — KETOROLAC TROMETHAMINE 30 MG/ML IJ SOLN
30.0000 mg | Freq: Once | INTRAMUSCULAR | Status: AC
  Administered 2013-08-06: 30 mg via INTRAVENOUS
  Filled 2013-08-06: qty 1

## 2013-08-06 MED ORDER — ONDANSETRON HCL 4 MG PO TABS: 4.0000 mg | ORAL_TABLET | Freq: Four times a day (QID) | ORAL | Status: DC | PRN

## 2013-08-06 MED ORDER — NICOTINE 7 MG/24HR TD PT24
7.0000 mg | MEDICATED_PATCH | Freq: Every day | TRANSDERMAL | Status: DC
  Administered 2013-08-06 – 2013-08-10 (×5): 7 mg via TRANSDERMAL
  Filled 2013-08-06 (×8): qty 1

## 2013-08-06 MED ORDER — NICOTINE 7 MG/24HR TD PT24
MEDICATED_PATCH | TRANSDERMAL | Status: AC
  Filled 2013-08-06: qty 1

## 2013-08-06 MED ORDER — ACETAMINOPHEN 325 MG PO TABS: 650.0000 mg | ORAL_TABLET | Freq: Four times a day (QID) | ORAL | Status: DC | PRN

## 2013-08-06 MED ORDER — OXYCODONE-ACETAMINOPHEN 5-325 MG PO TABS
1.0000 | ORAL_TABLET | ORAL | Status: DC | PRN
  Administered 2013-08-06 – 2013-08-10 (×19): 2 via ORAL
  Filled 2013-08-06 (×20): qty 2

## 2013-08-06 MED ORDER — ACETAMINOPHEN 650 MG RE SUPP: 650.0000 mg | Freq: Four times a day (QID) | RECTAL | Status: DC | PRN

## 2013-08-06 MED ORDER — SODIUM CHLORIDE 0.9 % IV BOLUS (SEPSIS)
1000.0000 mL | Freq: Once | INTRAVENOUS | Status: AC
  Administered 2013-08-06: 1000 mL via INTRAVENOUS

## 2013-08-06 MED ORDER — ONDANSETRON HCL 4 MG/2ML IJ SOLN
4.0000 mg | Freq: Once | INTRAMUSCULAR | Status: AC
  Administered 2013-08-06: 4 mg via INTRAVENOUS
  Filled 2013-08-06: qty 2

## 2013-08-06 MED ORDER — ACETAMINOPHEN 500 MG PO TABS
1000.0000 mg | ORAL_TABLET | Freq: Once | ORAL | Status: AC
  Administered 2013-08-06: 1000 mg via ORAL
  Filled 2013-08-06: qty 2

## 2013-08-06 MED ORDER — ALBUTEROL SULFATE HFA 108 (90 BASE) MCG/ACT IN AERS: 2.0000 | INHALATION_SPRAY | Freq: Four times a day (QID) | RESPIRATORY_TRACT | Status: DC | PRN

## 2013-08-06 MED ORDER — DOXYCYCLINE HYCLATE 100 MG PO TABS
100.0000 mg | ORAL_TABLET | Freq: Two times a day (BID) | ORAL | Status: DC
  Administered 2013-08-06 – 2013-08-10 (×8): 100 mg via ORAL
  Filled 2013-08-06 (×8): qty 1

## 2013-08-06 MED ORDER — SODIUM CHLORIDE 0.9 % IJ SOLN
3.0000 mL | Freq: Two times a day (BID) | INTRAMUSCULAR | Status: DC
  Administered 2013-08-06 – 2013-08-10 (×8): 3 mL via INTRAVENOUS
  Filled 2013-08-06: qty 3

## 2013-08-06 MED ORDER — INFLUENZA VIRUS VACC SPLIT PF IM SUSP: 0.5000 mL | INTRAMUSCULAR | Status: DC

## 2013-08-06 NOTE — H&P (Addendum)
History and Physical  Erika Mcdonald ZOX:[id number] DOB: 12/04/1985 DOA: 08/06/2013  Referring physician: Dr. Gwendolyn Grant PCP: Toma Deiters, MD   Chief Complaint: rash  HPI:  27 year old woman presents the emergency department with a history of rash, bilateral lower extremity pain and subjective fever. Initial evaluation was notable for fever, rash and hypokalemia the patient was referred for observation.  History obtained from patient and with husband out of the room. 9/5 she 1 small area of a red lesion on her left forearm which looked like a "bug bite". Not particularly pruritic. Over the next 2 days the rash red upper left arm and then was found in her right arm, bilateral thighs and lower legs. It seemed to improve 24-36 hours before presentation but then worsened last night after showering. It is not painful to touch and is not pruritic. She has some tenderness over her lower legs and feet not clearly connected to the rash. There were one or 2 lesions on her chest.  She recently had a couch that had bed bugs. She denies tick bites. No recent painless lesions, lumps or bumps. She has a history of genital herpes contracted in the last year but has not had any outbreaks. She has no vaginal pain, sores or discharge. She has been faithful to her husband but does report he had an extramarital affair some months back with a promiscuous woman.  In the emergency Department temperature 102.9. Tachycardic up to 140. Blood pressure stable to borderline low. No hypoxia. Potassium 2.6. Complete metabolic panel otherwise unremarkable. CBC unremarkable with normal white blood cell count. Urinalysis negative. Blood cultures pending.  Review of Systems:  Positive for subjective fever at home, generalized myalgias, especially in the lower legs. Negative for visual changes, sore throat, chest pain, SOB, dysuria, bleeding, n/v/abdominal pain.  Past Medical History  Diagnosis Date  . Anxiety   . Chronic back  pain     prior pt of [facility],   . Fibromyalgia   . Migraine headache   . Abnormal Pap smear   . HSV-2 (herpes simplex virus 2) infection     Past Surgical History  Procedure Laterality Date  . [place]    . Laparoscopic bilateral salpingectomy Bilateral 02/20/2013    Procedure: LAPAROSCOPIC BILATERAL SALPINGECTOMY;  Surgeon: Tilda Burrow, MD;  Location: [facility];  Service: Gynecology;  Laterality: Bilateral;  . Endometrial ablation N/A 02/20/2013    Procedure: ENDOMETRIAL ABLATION;  Surgeon: Tilda Burrow, MD;  Location: [facility];  Service: Gynecology;  Laterality: N/A;  temp 86-88 degrees C; total in 9cc D5W, total out; total time = 8 minutes 49 seconds    Social History:  reports that she has been smoking Cigarettes.  She has a 3.75 pack-year smoking history. She has never used smokeless tobacco. She reports that she uses illicit drugs (Oxycodone). She reports that she does not drink alcohol.  Allergies  Allergen Reactions  . Bee Venom Anaphylaxis and Swelling  . Ciprofloxacin Shortness Of Breath  . Levetiracetam Other (See Comments)    Paradoxical;Triggers seizures  . Sulfa Antibiotics Shortness Of Breath  . Neurontin [Gabapentin] Other (See Comments)    Unknown  . Robaxin [Methocarbamol] Other (See Comments)    Triggers Seizures   . Ultram [Tramadol Hcl] Swelling    Family History  Problem Relation Age of Onset  . Diabetes Maternal Grandmother   . Hypertension Other      Prior to Admission medications   Medication Sig Start Date End Date Taking? Authorizing Provider  ibuprofen (ADVIL,MOTRIN) 200 MG tablet Take 800 mg by mouth every 6 (six) hours as needed for pain.   Yes Historical Provider, MD  albuterol (PROVENTIL HFA;VENTOLIN HFA) 108 (90 BASE) MCG/ACT inhaler Inhale 2 puffs into the lungs every 6 (six) hours as needed. For shortness of breath     Historical Provider, MD   Physical Exam: Filed Vitals:   08/06/13 1400 08/06/13 1403 08/06/13 1423 08/06/13 1553  BP:  113/68 122/86  94/55  Pulse: 117 119  105  Temp:   102.9 F (39.4 C) 98.4 F (36.9 C)  TempSrc:   Rectal   Resp: 21 16  19   Height:      Weight:      SpO2: 100% 99%  95%   General:  Examined in the emergency department. Appears calm and mildly uncomfortable. Nontoxic. Eyes: PERRL, normal lids, irises  ENT: grossly normal hearing, lips & tongue Neck: Supple, nontender, appears to have full range of movement. no masses or thyromegaly, mild tender. Cervical lymphadenopathy on the right difficult to appreciate Cardiovascular: Tachycardic, regular rhythm, no m/r/g. No LE edema. Respiratory: CTA bilaterally, no w/r/r. Normal respiratory effort. Abdomen: soft, ntnd Skin: Erythematous macules of various sizes from 15 mm up to 1 cm, somewhat circular but also irregular, blanch. No petechiae. Generally nontender to palpation. Predominantly on forearms, upper arms, lower legs and thighs. No rash on palms or soles. Most entirely absent from back and chest. GU: Deferred. Musculoskeletal: grossly normal tone BUE/BLE Psychiatric: grossly normal mood and affect, speech fluent and appropriate Neurologic: grossly non-focal.  Wt Readings from Last 3 Encounters:  08/06/13 70.308 kg (155 lb)  05/01/13 63.141 kg (139 lb 3.2 oz)  04/09/13 65.862 kg (145 lb 3.2 oz)    Labs on Admission:  Basic Metabolic Panel:  Recent Labs Lab 08/06/13 1344  NA 133*  K 2.6*  CL 93*  CO2 25  GLUCOSE 98  BUN 8  CREATININE 0.62  CALCIUM 9.6    Liver Function Tests:  Recent Labs Lab 08/06/13 1344  AST 24  ALT 18  ALKPHOS 83  BILITOT 0.2*  PROT 8.0  ALBUMIN 3.5   CBC:  Recent Labs Lab 08/06/13 1344  WBC 7.0  NEUTROABS 5.6  HGB 11.4*  HCT 33.6*  MCV 81.8  PLT 265     Principal Problem:   Rash and nonspecific skin eruption Active Problems:   Chronic low back pain   Fever, unspecified   Hypokalemia   Assessment/Plan 1. Rash on arms, legs with associated  fever 2. Hypokalemia 3. Chronic back pain, fibromyalgia, anxiety 4. History of general herpes  Etiology unclear. No headache or neck pain. No history to suggest meningitis or fulminant bacterial infection, clinically nontoxic but febrile and tachycardic. No history of tick bite, no leukopenia or transaminitis. This does not have the appearance of bed bug bites. Rash spares the palms and soles but syphilis should be considered, followup RPR. Laboratory workup as below. Will continue empiric doxycycline for now. She is not breast-feeding. Possible viral exanthem.   Observe, monitor fever curve, rash, clinical stability  Check urine pregnancy  RPR  Repeat laboratory studies in the morning  Replete potassium  Code Status: full code  DVT prophylaxis: early ambulation Family Communication: discussed with husband at bedside Disposition Plan/Anticipated LOS: observation, home when improved.  Time spent: 65 minutes  Brendia Sacks, MD  [facility] Pager [phone] 08/06/2013, 5:33 PM

## 2013-08-06 NOTE — ED Notes (Signed)
CRITICAL VALUE ALERT  Critical value received:  Potassium 2.6  Date of notification:  08/06/13  Time of notification:  1543  Critical value read back:yes  Nurse who received alert:  c Karlin Binion rn  MD notified (1st page):  Dr Gwendolyn Grant  Time of first page:    MD notified (2nd page):  Time of second page:  Responding MD:    Time MD responded:

## 2013-08-06 NOTE — ED Notes (Signed)
Rash to bil arms and legs x 3 days.

## 2013-08-06 NOTE — ED Provider Notes (Signed)
CSN: [id number]     Arrival date & time 08/06/13  1247 History  This chart was scribed for non-physician practitioner working with Dagmar Hait, MD by Valera Castle, ED scribe and Bennett Scrape, ED Scribe. This patient was seen in room APA03/APA03 and the patient's care was started at 1:15 PM.    Chief Complaint  Patient presents with  . Rash    Patient is a 27 y.o. female presenting with rash. The history is provided by the patient. No language interpreter was used.  Rash Location:  Torso, hand and leg Hand rash location:  R hand, L hand, L finger and R finger Leg rash location:  L leg, R leg, L foot and R foot Quality: painful and redness   Pain details:    Quality: stabbing.   Severity:  Severe   Onset quality:  Gradual   Duration:  3 days   Timing:  Constant   Progression:  Worsening Onset quality:  Gradual Duration:  3 days Timing:  Constant Chronicity:  New Relieved by:  Nothing Associated symptoms: headaches (Similary to her h/o migraines. )   Associated symptoms: no diarrhea, no fever, no nausea and not vomiting    HPI Comments: Erika Mcdonald is a 27 y.o. female who presents to the Emergency Department complaining of gradual onset, gradually spreading rash to her bilateral arms and legs, onset 3 days ago. She states the rash started with an area on her right hand/arm and then spread to her other extremities. She states that when she got out of the shower the rash had gotten worse. She states that she feels like the sores are "stabbing" her with touch and are very painful. She reports chills with her rash. She denies any new medications, or stopping medication. She reports a headache as an associate symptom, onset 1 week ago, but that it is similar to her h/o migraines. She denies fever, diarrhea, emesis, excessive vaginal bleeding, vision changes, dysuria. She denies eating anything since the onset of her rash, but has been drinking water regularly. She has had the  same sexual partner for 13 years.  Past Medical History  Diagnosis Date  . Anxiety   . Chronic back pain     prior pt of [place],   . Fibromyalgia   . Migraine headache   . Abnormal Pap smear   . HSV-2 (herpes simplex virus 2) infection    Past Surgical History  Procedure Laterality Date  . Leep    . Laparoscopic bilateral salpingectomy Bilateral 02/20/2013    Procedure: LAPAROSCOPIC BILATERAL SALPINGECTOMY;  Surgeon: Tilda Burrow, MD;  Location: [facility];  Service: Gynecology;  Laterality: Bilateral;  . Endometrial ablation N/A 02/20/2013    Procedure: ENDOMETRIAL ABLATION;  Surgeon: Tilda Burrow, MD;  Location: [facility];  Service: Gynecology;  Laterality: N/A;  temp 86-88 degrees C; total in 9cc D5W, total out; total time = 8 minutes 49 seconds   Family History  Problem Relation Age of Onset  . Diabetes Maternal Grandmother   . Hypertension Other    History  Substance Use Topics  . Smoking status: Current Every Day Smoker -- 0.25 packs/day for 15 years    Types: Cigarettes  . Smokeless tobacco: Never Used  . Alcohol Use: No   OB History   Grav Para Term Preterm Abortions TAB SAB Ect Mult Living   4 4 4       4      Review of Systems  Constitutional: Positive for  chills. Negative for fever.  Eyes: Negative for visual disturbance.  Gastrointestinal: Negative for nausea, vomiting and diarrhea.  Genitourinary: Negative for dysuria and vaginal bleeding.  Skin: Positive for rash.  Neurological: Positive for headaches (Similary to her h/o migraines. ).  All other systems reviewed and are negative.    Allergies  Bee venom; Ciprofloxacin; Levetiracetam; Sulfa antibiotics; Neurontin; Robaxin; and Ultram  Home Medications   Current Outpatient Rx  Name  Route  Sig  Dispense  Refill  . ibuprofen (ADVIL,MOTRIN) 200 MG tablet   Oral   Take 800 mg by mouth every 6 (six) hours as needed for pain.         Marland Kitchen albuterol (PROVENTIL HFA;VENTOLIN HFA) 108 (90 BASE) MCG/ACT  inhaler   Inhalation   Inhale 2 puffs into the lungs every 6 (six) hours as needed. For shortness of breath           Triage Vitals: BP 120/84  Pulse 140  Temp(Src) 99.5 F (37.5 C) (Oral)  Resp 16  Ht 5\' 2"  (1.575 m)  Wt 155 lb (70.308 kg)  BMI 28.34 kg/m2  SpO2 100%  Physical Exam  Nursing note and vitals reviewed. Constitutional: She is oriented to person, place, and time. She appears well-developed and well-nourished. No distress.  HENT:  Head: Normocephalic and atraumatic.  Left submandibular lymph node swelling and tender.    Eyes: EOM are normal.  Neck: Neck supple. No tracheal deviation present.  Cardiovascular: Normal rate.   Pulmonary/Chest: Effort normal. No respiratory distress. She has no wheezes. She has no rales.  Musculoskeletal: Normal range of motion.  Neurological: She is alert and oriented to person, place, and time.  Skin: Skin is warm and dry.  Diffuse, blanchable macules on arms, legs, trunk. No lseions on palms or soles. No mucosa involvement. No central clearing.  Psychiatric: She has a normal mood and affect. Her behavior is normal.    ED Course  Procedures (including critical care time)  DIAGNOSTIC STUDIES: Oxygen Saturation is 100% on room air, normal by my interpretation.    COORDINATION OF CARE: 1:39 PM-Discussed treatment plan which includes (CXR, CBC panel, CMP, UA) with pt at bedside and pt agreed to plan.   1:52 PM - Advised pt that she will be admitted to hospital and that there is clinical suspicion of [facility] Fever.    Labs Review Labs Reviewed  URINALYSIS, ROUTINE W REFLEX MICROSCOPIC - Abnormal; Notable for the following:    Hgb urine dipstick TRACE (*)    Protein, ur 100 (*)    All other components within normal limits  CBC WITH DIFFERENTIAL - Abnormal; Notable for the following:    Hemoglobin 11.4 (*)    HCT 33.6 (*)    Neutrophils Relative % 80 (*)    All other components within normal limits  COMPREHENSIVE METABOLIC  PANEL - Abnormal; Notable for the following:    Sodium 133 (*)    Potassium 2.6 (*)    Chloride 93 (*)    Total Bilirubin 0.2 (*)    All other components within normal limits  URINE MICROSCOPIC-ADD ON - Abnormal; Notable for the following:    Squamous Epithelial / LPF MANY (*)    Bacteria, UA FEW (*)    Casts HYALINE CASTS (*)    All other components within normal limits  CULTURE, BLOOD (ROUTINE X 2)  CULTURE, BLOOD (ROUTINE X 2)  CULTURE, BLOOD (ROUTINE X 2)  URINALYSIS, ROUTINE W REFLEX MICROSCOPIC  SEDIMENTATION RATE  RPR  Imaging Review No results found.  MDM   1. Rash   2. Fever   3. Tachycardia   4. Myalgia    27 year old female presents with rash. Impression again 3 days ago. Began in her left forearm it has spread centrally. Patient also noticed rash was also her legs and spread upward. Patient denies any fever, however has had some chills. She also reports diffuse myalgias and pain with standing. Denies any nausea, vomiting, diarrhea. She believes that the rash began with a bug bite like a mosquito bite on her left forearm. She denies any history of tick exposure. She states one headache 3 days ago, which he stated felt like a migraine, however no further headaches now. Concern that she was possibly exposed to bedbugs. Rash is erythematous macules on her arms legs and trunk. No lesions on palms or soles. No splinter hemorrhages in her nails. Lesions blanch and are tender. There is no mucosal involvement. She has this right submandibular lymphadenopathy that is tender. Concern for possible RMSF versus other tickborne related disease with the myalgias. She is not showing any signs of meningitis and she does not need a lumbar puncture at this time. I do not feel he is lesions are consistent with bedbugs as there are no bite marks centrally. There is no central clearing of the rash. We'll check labs. She is borderline febrile and tachycardic here. We'll give her pain medicine.  Rectal temp 102.9. RPR sent. Labs show hypokalemia, no thrombocytopenia, no elevation of transaminases.  Admitted to the hospital for observation due to her hypokalemia and tachycardia.   I personally performed the services described in this documentation, which was scribed in my presence. The recorded information has been reviewed and is accurate.     Dagmar Hait, MD 08/06/13 [id number]

## 2013-08-07 LAB — RPR: RPR Ser Ql: NONREACTIVE

## 2013-08-07 LAB — COMPREHENSIVE METABOLIC PANEL
ALT: 14 U/L (ref 0–35)
AST: 19 U/L (ref 0–37)
CO2: 24 mEq/L (ref 19–32)
Chloride: 104 mEq/L (ref 96–112)
Creatinine, Ser: 0.49 mg/dL — ABNORMAL LOW (ref 0.50–1.10)
GFR calc non Af Amer: >90 mL/min (ref >90)
Glucose, Bld: 110 mg/dL — ABNORMAL HIGH (ref 70–99)
Total Bilirubin: 0.2 mg/dL — ABNORMAL LOW (ref 0.3–1.2)

## 2013-08-07 LAB — CBC
MCH: 27.9 pg (ref 26.0–34.0)
MCHC: 33.9 g/dL (ref 30.0–36.0)
Platelets: 228 10*3/uL (ref 150–400)
RBC: 3.48 MIL/uL — ABNORMAL LOW (ref 3.87–5.11)

## 2013-08-07 MED ORDER — PENICILLIN G BENZATHINE 1200000 UNIT/2ML IM SUSP
2.4000 10*6.[IU] | Freq: Once | INTRAMUSCULAR | Status: AC
  Administered 2013-08-07: 2.4 10*6.[IU] via INTRAMUSCULAR
  Filled 2013-08-07: qty 4

## 2013-08-07 MED ORDER — IBUPROFEN 800 MG PO TABS
800.0000 mg | ORAL_TABLET | Freq: Four times a day (QID) | ORAL | Status: DC | PRN
  Administered 2013-08-07 – 2013-08-09 (×7): 800 mg via ORAL
  Filled 2013-08-07 (×7): qty 1

## 2013-08-07 MED ORDER — HYDROMORPHONE HCL PF 1 MG/ML IJ SOLN
1.0000 mg | INTRAMUSCULAR | Status: DC | PRN
  Administered 2013-08-07 – 2013-08-10 (×17): 1 mg via INTRAVENOUS
  Filled 2013-08-07 (×18): qty 1

## 2013-08-07 NOTE — Progress Notes (Signed)
[facility] PROGRESS NOTE  Erika Mcdonald:[id number] DOB: 04-08-1986 DOA: 08/06/2013 PCP: Toma Deiters, MD  Summary: 27 year old woman presents the emergency department with a history of rash, bilateral lower extremity pain and subjective fever. Initial evaluation was notable for fever, rash and hypokalemia the patient was referred for observation. Etiology remains unclear but clinical features are suggestive of syphilis despite negative RPR, will treat with penicillin and check confirmatory study. Continue doxycycline empirically but take tick point illness is doubted.  Assessment/Plan: 1. Rash on arms, legs with associated fever 2. Chronic back pain, fibromyalgia, anxiety 3. History of genital herpes   Empiric penicillin  FTA-ABS   Continue empiric of cyclic for now  Code Status: Full code DVT prophylaxis: Early ambulation Family Communication: None Disposition Plan: Home when improved  Brendia Sacks, MD  [facility]  Pager [phone] If 7PM-7AM, please contact night-coverage at [email or web address], password [facility] 08/07/2013, 11:21 AM  LOS: 1 day   Consultants:  none  Procedures:    Antibiotics:  Penicillin G Benzathine 2.4MU intra-muscular x 1  Doxycycline 9/8 >>  HPI/Subjective: No change in rash. Complains of muscle aches in arms and legs. No abdominal pain. Did vomit this morning.  Objective: Filed Vitals:   08/06/13 1744 08/06/13 2123 08/07/13 0540 08/07/13 0804  BP: 97/53 100/58 92/40   Pulse: 97 81 89   Temp: 98.3 F (36.8 C) 98.5 F (36.9 C) 99.1 F (37.3 C)   TempSrc: Oral Oral Oral   Resp: 24 20 16    Height: 5\' 3"  (1.6 m)     Weight: 63.6 kg (140 lb 3.4 oz)     SpO2: 100% 100% 97% 96%    Intake/Output Summary (Last 24 hours) at 08/07/13 1121 Last data filed at 08/07/13 0541  Gross per 24 hour  Intake    360 ml  Output    200 ml  Net    160 ml     Filed Weights   08/06/13 1257 08/06/13 1744  Weight: 70.308 kg (155 lb)  63.6 kg (140 lb 3.4 oz)    Exam:   No fever since yesterday afternoon. Vitals stable.  General: Appears calm, mildly uncomfortable. Nontoxic.  Cardiovascular: Regular rate and rhythm. No murmur, rub, gallop. No lower extremity edema.  Respiratory: Clear to auscultation bilaterally. No wheezes, rales, rhonchi. Normal respiratory effort.  Abdomen: Soft, nontender  Skin: No change in rash, erythematous macules primarily arms, Legs sparing the palms and soles. Some rash now on the abdomen. The back is spared.  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  Hemoglobin 9.7.  RPR nonreactive  HIV nonreactive  Scheduled Meds: . doxycycline  100 mg Oral Q12H  . influenza vac split quadrivalent PF  0.5 mL Intramuscular Tomorrow-1000  . nicotine  7 mg Transdermal Daily  . sodium chloride  3 mL Intravenous Q12H   Continuous Infusions:   Principal Problem:   Rash and nonspecific skin eruption Active Problems:   Chronic low back pain   Fever, unspecified   Hypokalemia   Time spent 15 minutes

## 2013-08-07 NOTE — Progress Notes (Signed)
UR Chart Review Completed  

## 2013-08-08 DIAGNOSIS — G8929 Other chronic pain: Secondary | ICD-10-CM

## 2013-08-08 DIAGNOSIS — M545 Low back pain: Secondary | ICD-10-CM

## 2013-08-08 DIAGNOSIS — IMO0001 Reserved for inherently not codable concepts without codable children: Secondary | ICD-10-CM

## 2013-08-08 DIAGNOSIS — M25579 Pain in unspecified ankle and joints of unspecified foot: Secondary | ICD-10-CM

## 2013-08-08 LAB — CK: Total CK: 25 U/L (ref 7–177)

## 2013-08-08 LAB — CBC
MCH: 28.1 pg (ref 26.0–34.0)
Platelets: 249 10*3/uL (ref 150–400)
RBC: 3.56 MIL/uL — ABNORMAL LOW (ref 3.87–5.11)
WBC: 4.3 10*3/uL (ref 4.0–10.5)

## 2013-08-08 LAB — COMPREHENSIVE METABOLIC PANEL
BUN: 9 mg/dL (ref 6–23)
CO2: 25 mEq/L (ref 19–32)
Chloride: 106 mEq/L (ref 96–112)
Creatinine, Ser: 0.51 mg/dL (ref 0.50–1.10)
GFR calc non Af Amer: >90 mL/min (ref >90)
Glucose, Bld: 94 mg/dL (ref 70–99)
Total Bilirubin: <0.1 mg/dL — ABNORMAL LOW (ref 0.3–1.2)

## 2013-08-08 MED ORDER — KETOROLAC TROMETHAMINE 30 MG/ML IJ SOLN
30.0000 mg | Freq: Four times a day (QID) | INTRAMUSCULAR | Status: DC
  Administered 2013-08-08 – 2013-08-10 (×7): 30 mg via INTRAVENOUS
  Filled 2013-08-08 (×7): qty 1

## 2013-08-08 NOTE — Progress Notes (Signed)
[facility] PROGRESS NOTE  ERSEL WADLEIGH EAV:[id number] DOB: Jun 03, 1986 DOA: 08/06/2013 PCP: Toma Deiters, MD  Assessment/Plan: 1. Maculo-papular rash on arms/legs. Patient has been afebrile since being in the hospital. She is continued on doxycycline. Etiology of her rash is not entirely clear. RPR/HIV was found to be negative. Confirmatory test for syphilis as has been sent. She's not been started on any new medications. Other etiologies could include a connective tissue disorder. She does have a markedly elevated ESR. Will check serum CK, C3/C4 complement levels, rheumatoid factor, anti-double-stranded DNA antibody. She may need further referral to a rheumatologist. 2. Chronic back pain. 3. Hypokalemia. Improved  Code Status: Full code Family Communication: Discussed with patient, tried to call her mom, but she was unavailable by phone Disposition Plan: Discharge home when improved   Consultants:  none  Procedures:  None  Antibiotics:  Doxycycline 9/8  Penicillin G, IM injection x1  HPI/Subjective: Patient had continued rash which appears to be somewhat stable. She is having increasing pain in her legs/ankles and reports difficulty walking.  Objective: Filed Vitals:   08/08/13 1359  BP: 104/57  Pulse: 76  Temp: 97.9 F (36.6 C)  Resp: 18    Intake/Output Summary (Last 24 hours) at 08/08/13 1946 Last data filed at 08/08/13 1800  Gross per 24 hour  Intake    800 ml  Output    500 ml  Net    300 ml   Filed Weights   08/06/13 1257 08/06/13 1744  Weight: 70.308 kg (155 lb) 63.6 kg (140 lb 3.4 oz)    Exam:   General:  No acute distress  Cardiovascular: S1, S2, regular rate and rhythm  Respiratory: Clear to auscultation bilaterally  Abdomen: Soft, nontender, positive bowel sounds  Musculoskeletal: Tenderness in calves and Achilles tendon bilaterally  Skin: Diffuse maculopapular rash on arms and legs bilaterally   Data Reviewed: Basic  Metabolic Panel:  Recent Labs Lab 08/06/13 1344 08/07/13 0551 08/08/13 0602  NA 133* 137 139  K 2.6* 3.8 3.6  CL 93* 104 106  CO2 25 24 25   GLUCOSE 98 110* 94  BUN 8 10 9   CREATININE 0.62 0.49* 0.51  CALCIUM 9.6 9.2 9.1  MG  --  2.0  --    Liver Function Tests:  Recent Labs Lab 08/06/13 1344 08/07/13 0551 08/08/13 0602  AST 24 19 16   ALT 18 14 10   ALKPHOS 83 68 65  BILITOT 0.2* 0.2* <0.1*  PROT 8.0 6.6 6.6  ALBUMIN 3.5 2.8* 2.9*   No results found for this basename: LIPASE, AMYLASE,  in the last 168 hours No results found for this basename: AMMONIA,  in the last 168 hours CBC:  Recent Labs Lab 08/06/13 1344 08/07/13 0551 08/08/13 0602  WBC 7.0 5.0 4.3  NEUTROABS 5.6  --   --   HGB 11.4* 9.7* 10.0*  HCT 33.6* 28.6* 29.5*  MCV 81.8 82.2 82.9  PLT 265 228 249   Cardiac Enzymes: No results found for this basename: CKTOTAL, CKMB, CKMBINDEX, TROPONINI,  in the last 168 hours BNP (last 3 results) No results found for this basename: PROBNP,  in the last 8760 hours CBG: No results found for this basename: GLUCAP,  in the last 168 hours  Recent Results (from the past 240 hour(s))  CULTURE, BLOOD (ROUTINE X 2)     Status: None   Collection Time    08/06/13  2:41 PM      Result Value Range Status   Specimen  Description BLOOD RIGHT HAND   Final   Special Requests BOTTLES DRAWN AEROBIC AND ANAEROBIC 15CC   Final   Culture NO GROWTH 2 DAYS   Final   Report Status PENDING   Incomplete  CULTURE, BLOOD (ROUTINE X 2)     Status: None   Collection Time    08/06/13  2:45 PM      Result Value Range Status   Specimen Description BLOOD RIGHT HAND   Final   Special Requests BOTTLES DRAWN AEROBIC AND ANAEROBIC 8CC   Final   Culture NO GROWTH 2 DAYS   Final   Report Status PENDING   Incomplete     Studies: No results found.  Scheduled Meds: . doxycycline  100 mg Oral Q12H  . nicotine  7 mg Transdermal Daily  . sodium chloride  3 mL Intravenous Q12H   Continuous  Infusions:   Principal Problem:   Rash and nonspecific skin eruption Active Problems:   Chronic low back pain   Fever, unspecified   Hypokalemia    Time spent:    [facility]  [facility] Pager [phone]. If 7PM-7AM, please contact night-coverage at [email or web address], password [facility] 08/08/2013, 7:46 PM  LOS: 2 days

## 2013-08-09 ENCOUNTER — Inpatient Hospital Stay (HOSPITAL_COMMUNITY): Payer: Medicaid Other

## 2013-08-09 LAB — C3 COMPLEMENT: C3 Complement: 179 mg/dL (ref 90–180)

## 2013-08-09 LAB — C4 COMPLEMENT: Complement C4, Body Fluid: 36 mg/dL (ref 10–40)

## 2013-08-09 IMAGING — US US EXTREM LOW VENOUS BILAT
1 series · 14 of 24 positions shown · non-contrast
Comparison: None.

CLINICAL DATA: Leg pain

VENOUS DUPLEX ULTRASOUND OF BILATERAL LOWER EXTREMITIES
TECHNIQUE: Gray-scale sonography with graded compression, as well
as color Doppler and duplex ultrasound, were performed to evaluate
the deep venous system of both lower extremities from the level of
the common femoral vein through the popliteal and proximal calf
veins.  Spectral Doppler was utilized to evaluate flow at rest and
with distal augmentation maneuvers.

[Series 1: us extrem low venous bilat · 0.05mm/px · 14 of 55 slices shown]
[im 1/55]
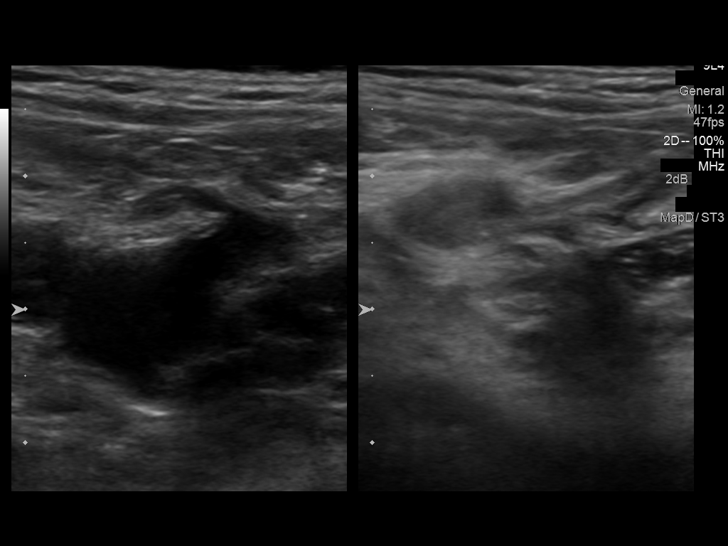
[im 5/55]
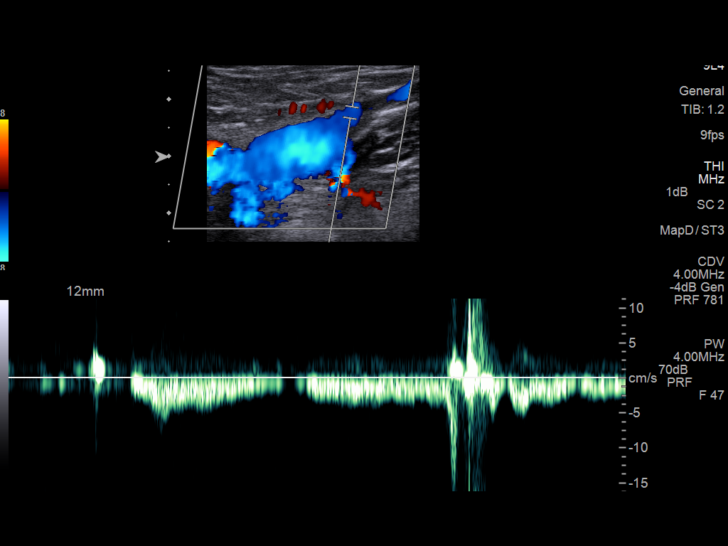
[im 10/55]
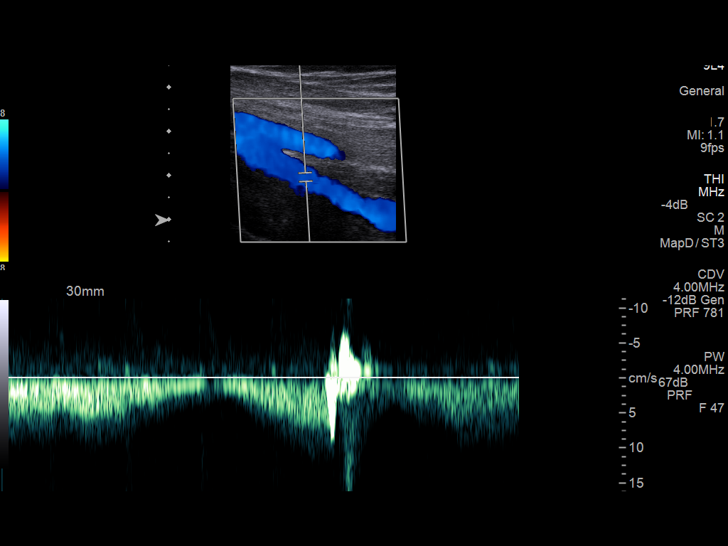
[im 15/55]
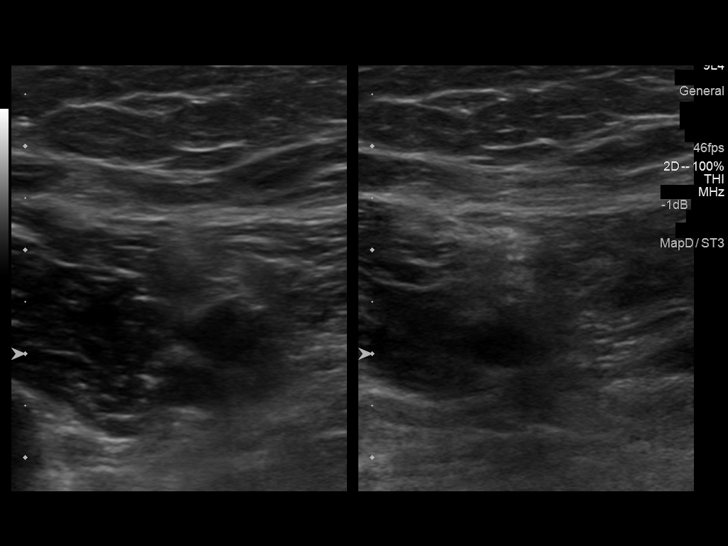
[im 17/55]
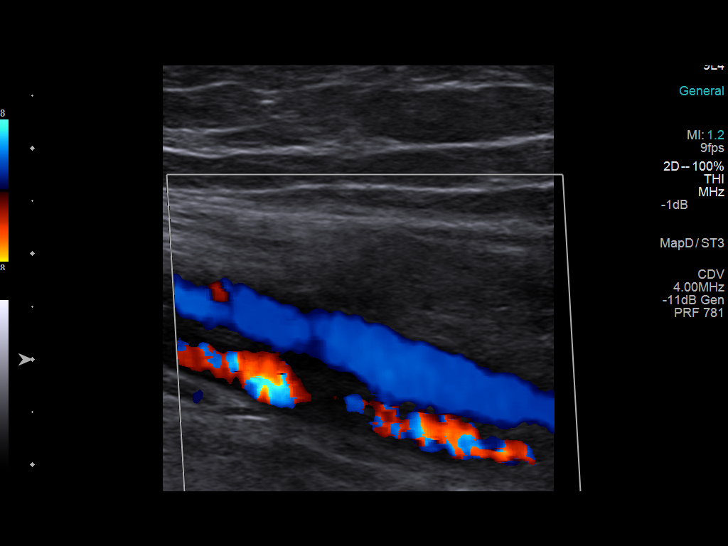
[im 22/55]
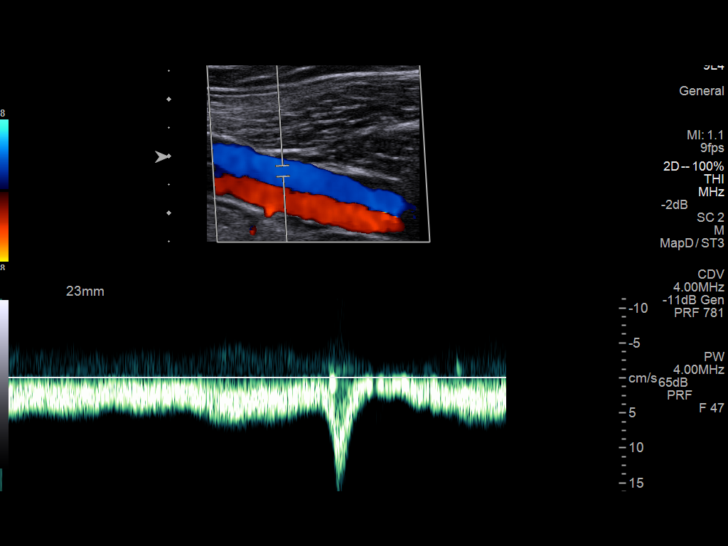
[im 26/55]
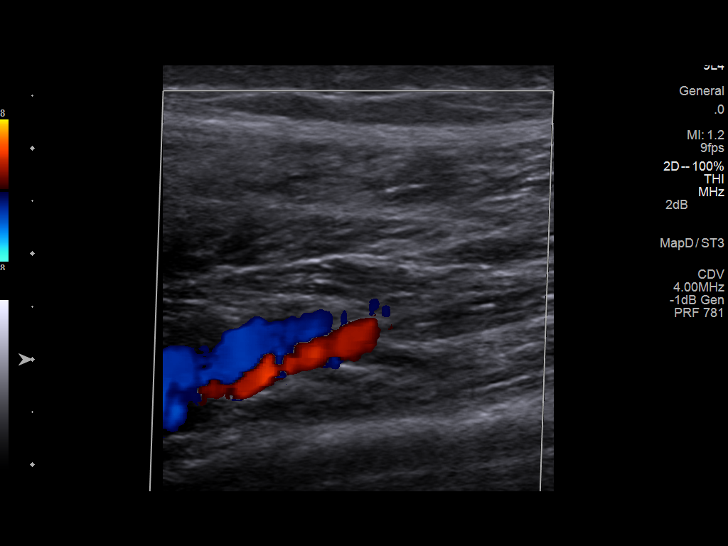
[im 29/55]
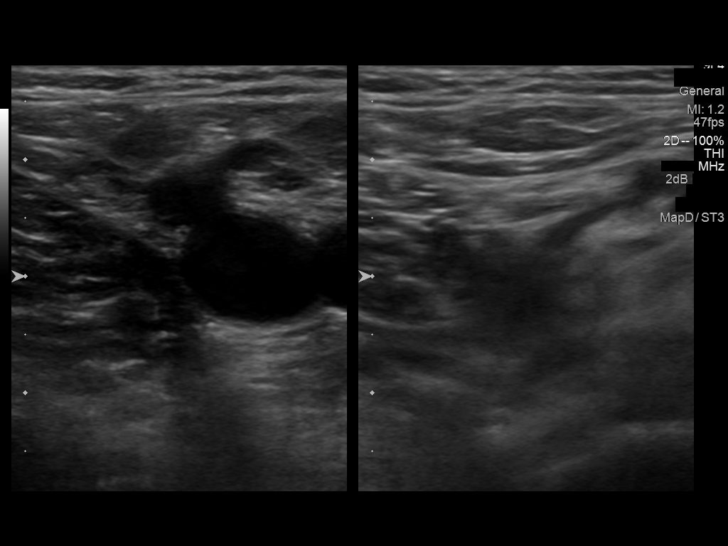
[im 33/55]
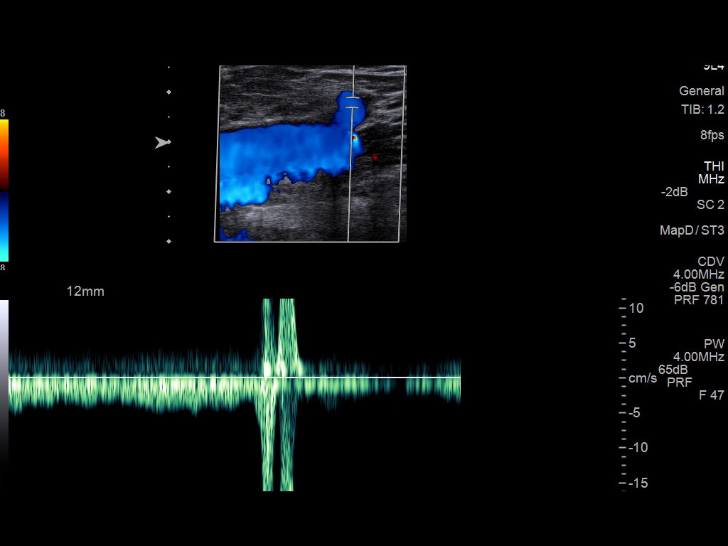
[im 38/55]
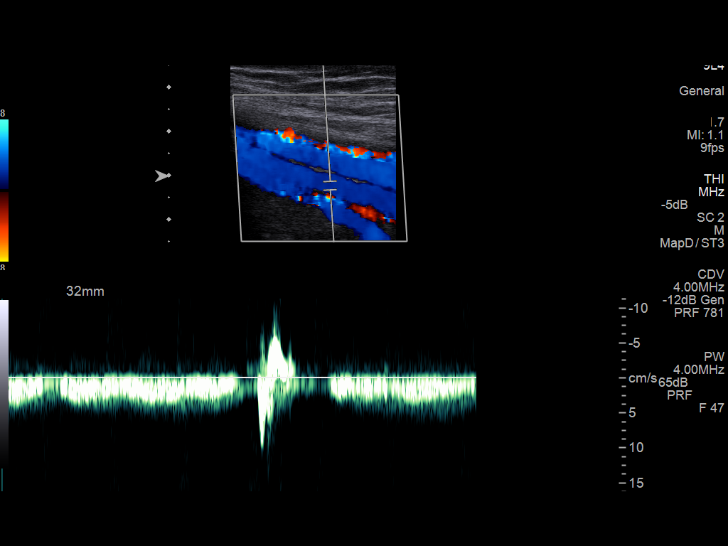
[im 43/55]
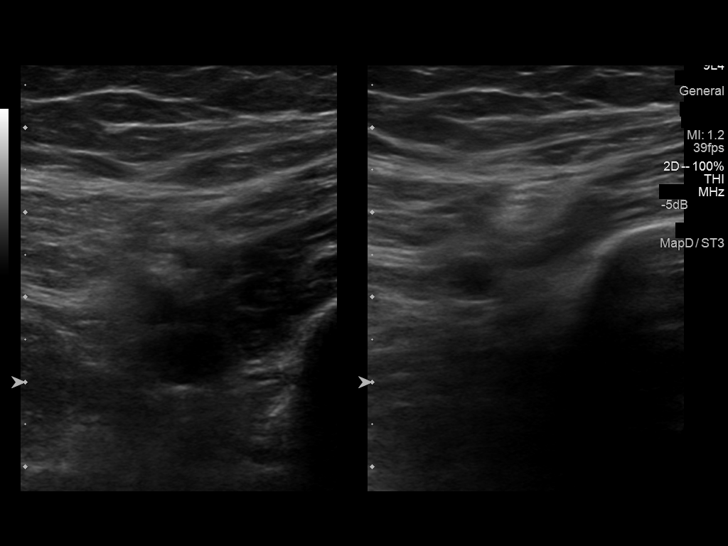
[im 45/55]
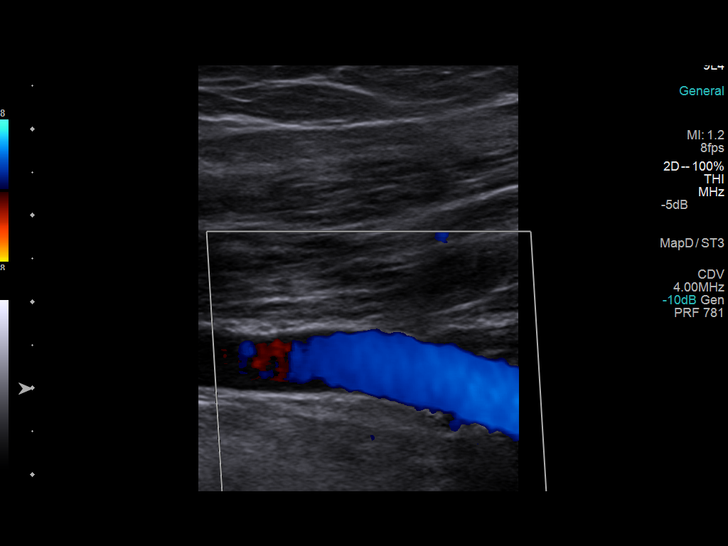
[im 50/55]
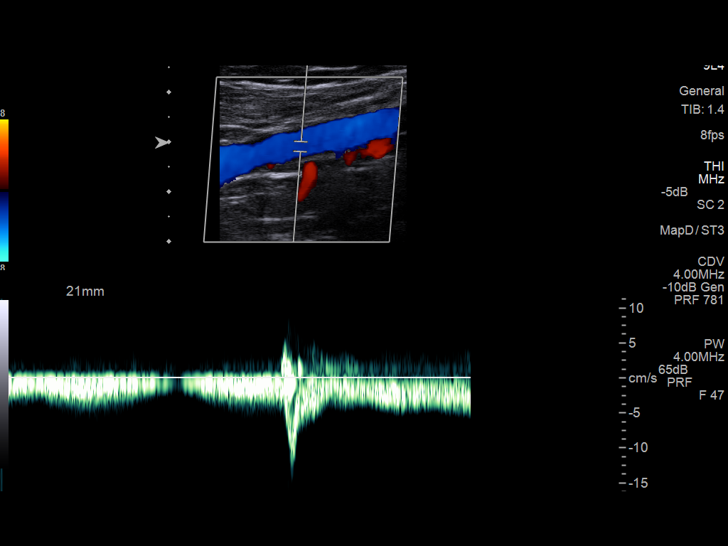
[im 55/55]
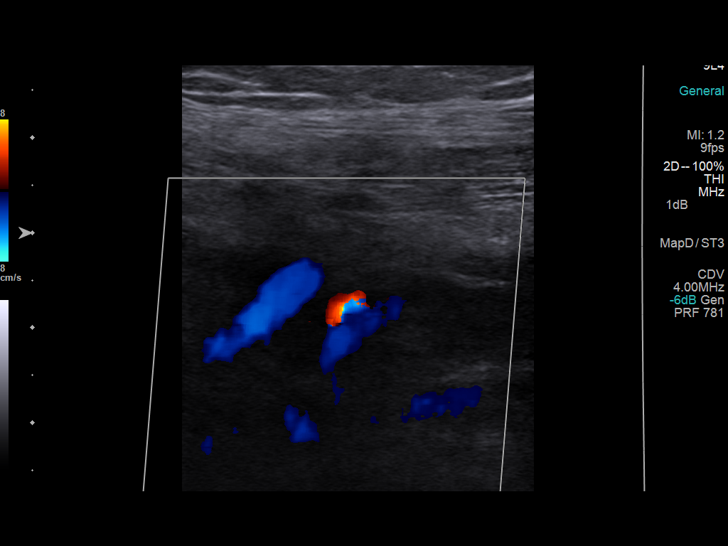

[14 of 24 positions shown; findings below may reference images not displayed]

FINDINGS: There is complete compressibility of the bilateral common
femoral, femoral, and popliteal veins.  Doppler analysis
demonstrates respiratory phasicity and augmentation of flow upon
calf compression.  No obvious calf vein or superficial vein
thrombosis.
IMPRESSION: No evidence of DVT in the lower extremities.

## 2013-08-09 NOTE — Progress Notes (Signed)
[facility] PROGRESS NOTE  Erika Mcdonald WJX:[id number] DOB: 01/04/1986 DOA: 08/06/2013 PCP: Toma Deiters, MD  Assessment/Plan: 1. Maculo-papular rash on arms/legs. Patient has been afebrile since being in the hospital. She is continued on doxycycline. Etiology of her rash is not entirely clear. RPR/HIV was found to be negative. Confirmatory test for syphilis as has been sent and is pending. She's not been started on any new medications. Other etiologies could include a connective tissue disorder. She does have a markedly elevated ESR. Workup including C3/C4 complement levels, , anti-double-stranded DNA antibody have been sent and are pending. Serum CK levels were found to be normal as was rheumatoid factor. She may need further referral to a rheumatologist. Etiologies include possible viral rash which would be self-limited. 2. Chronic back pain. 3. Hypokalemia. Improved  Code Status: Full code Family Communication: Discussed with patient, she does not wish her mother to receive information regarding her care. Disposition Plan: Anticipate discharge home in the morning if stable.   Consultants:  none  Procedures:  None  Antibiotics:  Doxycycline 9/8  Penicillin G, IM injection x1  HPI/Subjective: Rash appears to have improved over her arms but has progressed up her legs, she continues to have pain in her Achilles tendon areas  Objective: Filed Vitals:   08/09/13 1439  BP: 110/58  Pulse:   Temp:   Resp:     Intake/Output Summary (Last 24 hours) at 08/09/13 2033 Last data filed at 08/09/13 1800  Gross per 24 hour  Intake    720 ml  Output      0 ml  Net    720 ml   Filed Weights   08/06/13 1257 08/06/13 1744  Weight: 70.308 kg (155 lb) 63.6 kg (140 lb 3.4 oz)    Exam:   General:  No acute distress  Cardiovascular: S1, S2, regular rate and rhythm  Respiratory: Clear to auscultation bilaterally  Abdomen: Soft, nontender, positive bowel  sounds  Musculoskeletal: Tenderness in calves and Achilles tendon bilaterally  Skin: Diffuse maculopapular rash on arms and legs bilaterally   Data Reviewed: Basic Metabolic Panel:  Recent Labs Lab 08/06/13 1344 08/07/13 0551 08/08/13 0602  NA 133* 137 139  K 2.6* 3.8 3.6  CL 93* 104 106  CO2 25 24 25   GLUCOSE 98 110* 94  BUN 8 10 9   CREATININE 0.62 0.49* 0.51  CALCIUM 9.6 9.2 9.1  MG  --  2.0  --    Liver Function Tests:  Recent Labs Lab 08/06/13 1344 08/07/13 0551 08/08/13 0602  AST 24 19 16   ALT 18 14 10   ALKPHOS 83 68 65  BILITOT 0.2* 0.2* <0.1*  PROT 8.0 6.6 6.6  ALBUMIN 3.5 2.8* 2.9*   No results found for this basename: LIPASE, AMYLASE,  in the last 168 hours No results found for this basename: AMMONIA,  in the last 168 hours CBC:  Recent Labs Lab 08/06/13 1344 08/07/13 0551 08/08/13 0602  WBC 7.0 5.0 4.3  NEUTROABS 5.6  --   --   HGB 11.4* 9.7* 10.0*  HCT 33.6* 28.6* 29.5*  MCV 81.8 82.2 82.9  PLT 265 228 249   Cardiac Enzymes:  Recent Labs Lab 08/08/13 1951  CKTOTAL 25   BNP (last 3 results) No results found for this basename: PROBNP,  in the last 8760 hours CBG: No results found for this basename: GLUCAP,  in the last 168 hours  Recent Results (from the past 240 hour(s))  CULTURE, BLOOD (ROUTINE X 2)  Status: None   Collection Time    08/06/13  2:41 PM      Result Value Range Status   Specimen Description BLOOD RIGHT HAND   Final   Special Requests BOTTLES DRAWN AEROBIC AND ANAEROBIC 15CC   Final   Culture NO GROWTH 3 DAYS   Final   Report Status PENDING   Incomplete  CULTURE, BLOOD (ROUTINE X 2)     Status: None   Collection Time    08/06/13  2:45 PM      Result Value Range Status   Specimen Description BLOOD RIGHT HAND   Final   Special Requests BOTTLES DRAWN AEROBIC AND ANAEROBIC 8CC   Final   Culture NO GROWTH 3 DAYS   Final   Report Status PENDING   Incomplete     Studies: US Venous Img Lower  Bilateral  08/09/2013   *RADIOLOGY REPORT*  Clinical Data: Leg pain  VENOUS DUPLEX ULTRASOUND OF BILATERAL LOWER EXTREMITIES  Technique:  Gray-scale sonography with graded compression, as well as color Doppler and duplex ultrasound, were performed to evaluate the deep venous system of both lower extremities from the level of the common femoral vein through the popliteal and proximal calf veins.  Spectral Doppler was utilized to evaluate flow at rest and with distal augmentation maneuvers.  Comparison:  None.  Findings: There is complete compressibility of the bilateral common femoral, femoral, and popliteal veins.  Doppler analysis demonstrates respiratory phasicity and augmentation of flow upon calf compression.  No obvious calf vein or superficial vein thrombosis.  IMPRESSION: No evidence of DVT in the lower extremities.   Original Report Authenticated By: Jolaine Click, M.D.    Scheduled Meds: . doxycycline  100 mg Oral Q12H  . ketorolac  30 mg Intravenous Q6H  . nicotine  7 mg Transdermal Daily  . sodium chloride  3 mL Intravenous Q12H   Continuous Infusions:   Principal Problem:   Rash and nonspecific skin eruption Active Problems:   Chronic low back pain   Fever, unspecified   Hypokalemia    Time spent:    [facility]  [facility] Pager [phone]. If 7PM-7AM, please contact night-coverage at [email or web address], password [facility] 08/09/2013, 8:33 PM  LOS: 3 days

## 2013-08-10 DIAGNOSIS — M2550 Pain in unspecified joint: Secondary | ICD-10-CM | POA: Diagnosis present

## 2013-08-10 MED ORDER — OXYCODONE HCL 5 MG PO TABS: 10.0000 mg | ORAL_TABLET | ORAL | Status: AC | PRN

## 2013-08-10 MED ORDER — NICOTINE 21-14-7 MG/24HR TD KIT: 1.0000 | PACK | Freq: Every day | TRANSDERMAL | Status: AC

## 2013-08-10 MED ORDER — DOXYCYCLINE HYCLATE 100 MG PO TABS: 100.0000 mg | ORAL_TABLET | Freq: Two times a day (BID) | ORAL | Status: AC

## 2013-08-10 MED ORDER — IBUPROFEN 200 MG PO TABS: 800.0000 mg | ORAL_TABLET | Freq: Three times a day (TID) | ORAL | Status: AC | PRN

## 2013-08-10 NOTE — Progress Notes (Signed)
Pt discharged with instructions, prescriptions and carenotes.  She verbalized understanding.  The patient left the floor via w/c with staff and family instable condition.

## 2013-08-10 NOTE — Care Management Note (Signed)
    Page 1 of 1   08/10/2013     2:01:42 PM   CARE MANAGEMENT NOTE 08/10/2013  Patient:  Erika Mcdonald, Erika Mcdonald   Account Number:  [id number]  Date Initiated:  08/10/2013  Documentation initiated by:  Rosemary Holms  Subjective/Objective Assessment:   Pt lives at home with spouse. needs rollowing walker. No HH.     Action/Plan:   Anticipated DC Date:  08/10/2013   Anticipated DC Plan:  HOME/SELF CARE      DC Planning Services  CM consult      Choice offered to / List presented to:     DME arranged  Levan Hurst      DME agency  [facility]        Status of service:  Completed, signed off Medicare Important Message given?   (If response is "NO", the following Medicare IM given date fields will be blank) Date Medicare IM given:   Date Additional Medicare IM given:    Discharge Disposition:    Per UR Regulation:    If discussed at Long Length of Stay Meetings, dates discussed:    Comments:  08/10/13 Rosemary Holms RN BSN CM Rolling Walker to be delivered by [facility] to home
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
## 2013-08-11 LAB — CULTURE, BLOOD (ROUTINE X 2): Culture: NO GROWTH

## 2013-11-26 ENCOUNTER — Emergency Department (HOSPITAL_COMMUNITY)
Admission: EM | Admit: 2013-11-26 | Discharge: 2013-11-26 | Disposition: A | Discharge status: Home / Self Care | Payer: Medicaid Other

## 2013-11-26 NOTE — ED Notes (Signed)
No answer when called 

## 2013-12-08 ENCOUNTER — Encounter (HOSPITAL_COMMUNITY): Payer: Self-pay | Admitting: Emergency Medicine

## 2013-12-08 ENCOUNTER — Emergency Department (HOSPITAL_COMMUNITY)
Admission: EM | Admit: 2013-12-08 | Discharge: 2013-12-08 | Disposition: A | Discharge status: Home / Self Care | Payer: Medicaid Other | Attending: Emergency Medicine | Admitting: Emergency Medicine

## 2013-12-08 DIAGNOSIS — Z792 Long term (current) use of antibiotics: Secondary | ICD-10-CM | POA: Insufficient documentation

## 2013-12-08 DIAGNOSIS — G8929 Other chronic pain: Secondary | ICD-10-CM | POA: Insufficient documentation

## 2013-12-08 DIAGNOSIS — Z8659 Personal history of other mental and behavioral disorders: Secondary | ICD-10-CM | POA: Insufficient documentation

## 2013-12-08 DIAGNOSIS — Z79899 Other long term (current) drug therapy: Secondary | ICD-10-CM | POA: Insufficient documentation

## 2013-12-08 DIAGNOSIS — G8918 Other acute postprocedural pain: Secondary | ICD-10-CM | POA: Insufficient documentation

## 2013-12-08 DIAGNOSIS — K089 Disorder of teeth and supporting structures, unspecified: Secondary | ICD-10-CM | POA: Insufficient documentation

## 2013-12-08 DIAGNOSIS — Z8619 Personal history of other infectious and parasitic diseases: Secondary | ICD-10-CM | POA: Insufficient documentation

## 2013-12-08 DIAGNOSIS — Z8679 Personal history of other diseases of the circulatory system: Secondary | ICD-10-CM | POA: Insufficient documentation

## 2013-12-08 DIAGNOSIS — Z8739 Personal history of other diseases of the musculoskeletal system and connective tissue: Secondary | ICD-10-CM | POA: Insufficient documentation

## 2013-12-08 DIAGNOSIS — K0889 Other specified disorders of teeth and supporting structures: Secondary | ICD-10-CM

## 2013-12-08 DIAGNOSIS — K029 Dental caries, unspecified: Secondary | ICD-10-CM | POA: Insufficient documentation

## 2013-12-08 DIAGNOSIS — F17200 Nicotine dependence, unspecified, uncomplicated: Secondary | ICD-10-CM | POA: Insufficient documentation

## 2013-12-08 MED ORDER — OXYCODONE-ACETAMINOPHEN 5-325 MG PO TABS
2.0000 | ORAL_TABLET | Freq: Once | ORAL | Status: AC
  Administered 2013-12-08: 2 via ORAL
  Filled 2013-12-08: qty 2

## 2013-12-08 NOTE — ED Notes (Signed)
Pt not in waiting room

## 2013-12-08 NOTE — ED Provider Notes (Signed)
CSN: [id number]     Arrival date & time 12/08/13  27 History   First MD Initiated Contact with Patient 12/08/13 1705     Chief Complaint  Patient presents with  . Dental Pain   (Consider location/radiation/quality/duration/timing/severity/associated sxs/prior Treatment) HPI  Patient presents to the emergency department with a dental complaint. Symptoms began after her teeth were extracted on 12/06/2013. The patient has tried to alleviate pain with Percocet, Ibuprofen and Tyleno. She was given 12 Percocet but now she has run out and the pain is severe.  Pain rated at a 10/10, characterized as throbbing in nature and located left lower jaw. Patient denies fever, night sweats, chills, difficulty swallowing or opening mouth, SOB, nuchal rigidity or decreased ROM of neck.  Patient does not have a dentist and requests a resource guide at discharge.   Past Medical History  Diagnosis Date  . Anxiety   . Chronic back pain     prior pt of [place],   . Fibromyalgia   . Migraine headache   . Abnormal Pap smear   . HSV-2 (herpes simplex virus 2) infection    Past Surgical History  Procedure Laterality Date  . Leep    . Laparoscopic bilateral salpingectomy Bilateral 02/20/2013    Procedure: LAPAROSCOPIC BILATERAL SALPINGECTOMY;  Surgeon: Jonnie Kind, MD;  Location: [facility];  Service: Gynecology;  Laterality: Bilateral;  . Endometrial ablation N/A 02/20/2013    Procedure: ENDOMETRIAL ABLATION;  Surgeon: Jonnie Kind, MD;  Location: [facility];  Service: Gynecology;  Laterality: N/A;  temp 86-88 degrees C; total in 9cc D5W, total out; total time = 8 minutes 49 seconds   Family History  Problem Relation Age of Onset  . Diabetes Maternal Grandmother   . Hypertension Other    History  Substance Use Topics  . Smoking status: Current Every Day Smoker -- 0.25 packs/day for 15 years    Types: Cigarettes  . Smokeless tobacco: Never Used  . Alcohol Use: No   OB History   Grav Para Term Preterm  Abortions TAB SAB Ect Mult Living   '4 4 4       4     '$ Review of Systems The patient denies anorexia, fever, weight loss,, vision loss, decreased hearing, hoarseness, chest pain, syncope, dyspnea on exertion, peripheral edema, balance deficits, hemoptysis, abdominal pain, melena, hematochezia, severe indigestion/heartburn, hematuria, incontinence, genital sores, muscle weakness, suspicious skin lesions, transient blindness, difficulty walking, depression, unusual weight change, abnormal bleeding, enlarged lymph nodes, angioedema, and breast masses.  Allergies  Bee venom; Ciprofloxacin; Levetiracetam; Sulfa antibiotics; Neurontin; Robaxin; and Ultram  Home Medications   Current Outpatient Rx  Name  Route  Sig  Dispense  Refill  . albuterol (PROVENTIL HFA;VENTOLIN HFA) 108 (90 BASE) MCG/ACT inhaler   Inhalation   Inhale 2 puffs into the lungs every 6 (six) hours as needed. For shortness of breath          . doxycycline (VIBRA-TABS) 100 MG tablet   Oral   Take 1 tablet (100 mg total) by mouth every 12 (twelve) hours.   14 tablet   0   . ibuprofen (ADVIL,MOTRIN) 200 MG tablet   Oral   Take 4 tablets (800 mg total) by mouth every 8 (eight) hours as needed for pain.   30 tablet   0   . Nicotine 21-14-7 MG/24HR KIT   Transdermal   Place 1 patch onto the skin daily.   1 each   0   . oxyCODONE (ROXICODONE)  5 MG immediate release tablet   Oral   Take 2 tablets (10 mg total) by mouth every 4 (four) hours as needed for pain.   30 tablet   0    BP 124/73  Pulse 79  Temp(Src) 97.1 F (36.2 C) (Oral)  Resp 18  Ht $R'5\' 2"'rJ$  (1.575 m)  Wt 150 lb (68.04 kg)  BMI 27.43 kg/m2  SpO2 100% Physical Exam  Constitutional: She appears well-developed and well-nourished. No distress.  HENT:  Head: Normocephalic and atraumatic.  Mouth/Throat: Uvula is midline, oropharynx is clear and moist and mucous membranes are normal. Normal dentition. Dental caries (Pts tooth shows no obvious abscess but  moderate to severe tenderness to palpation of marked tooth) present. No uvula swelling.  Multiple empty sockets where teeth were recently extracted. No increased redness or discharge  Eyes: Pupils are equal, round, and reactive to light.  Neck: Trachea normal, normal range of motion and full passive range of motion without pain. Neck supple.  Cardiovascular: Normal rate, regular rhythm, normal heart sounds and normal pulses.   Pulmonary/Chest: Effort normal and breath sounds normal. No respiratory distress. Chest wall is not dull to percussion. She exhibits no tenderness, no crepitus, no edema, no deformity and no retraction.  Abdominal: Normal appearance.  Musculoskeletal: Normal range of motion.  Neurological: She is alert. She has normal strength.  Skin: Skin is warm, dry and intact. She is not diaphoretic.  Psychiatric: She has a normal mood and affect. Her speech is normal. Cognition and memory are normal.    ED Course  Procedures (including critical care time) Labs Review Labs Reviewed - No data to display Imaging Review No results found.  EKG Interpretation   None       MDM   1. Pain, dental    Patient given $RemoveBefor'10mg'CMUywpPfsqzC$  Percocet in ED. Advised that she will need to call dentist for medication refill, I declined narcotic refill in the ED Or return to the ER if she can not wait till Monday  28 y.o.Adylee Leonardo Dornfeld's evaluation in the Emergency Department is complete. It has been determined that no acute conditions requiring further emergency intervention are present at this time. The patient/guardian have been advised of the diagnosis and plan. We have discussed signs and symptoms that warrant return to the ED, such as changes or worsening in symptoms.  Vital signs are stable at discharge. Filed Vitals:   12/08/13 1654  BP: 124/73  Pulse: 79  Temp: 97.1 F (36.2 C)  Resp: 18    Patient/guardian has voiced understanding and agreed to follow-up with the PCP or  specialist.    Linus Mako, PA-C 12/08/13 1722

## 2013-12-08 NOTE — Discharge Instructions (Signed)

## 2013-12-08 NOTE — ED Notes (Addendum)
Pt had dental surgery on 12/06/13, complaining of pain more on left side of mouth. Out of percocet

## 2013-12-09 NOTE — ED Provider Notes (Signed)
Medical screening examination/treatment/procedure(s) were performed by non-physician practitioner and as supervising physician I was immediately available for consultation/collaboration.  EKG Interpretation   None        Aryeh Butterfield M Gregery Walberg, MD 12/09/13 1245 

## 2014-09-30 ENCOUNTER — Encounter (HOSPITAL_COMMUNITY): Payer: Self-pay | Admitting: Emergency Medicine

## 2016-05-07 MED FILL — SUBOXONE 8 MG-2 MG SL FILM: 8-2 | 7 days supply | Qty: 14 | Fill #0

## 2016-05-14 MED FILL — SUBOXONE 8 MG-2 MG SL FILM: 8-2 | 7 days supply | Qty: 18 | Fill #0

## 2016-05-21 MED FILL — SUBOXONE 8 MG-2 MG SL FILM: 8-2 | 7 days supply | Qty: 18 | Fill #0

## 2016-05-28 MED FILL — SUBOXONE 8 MG-2 MG SL FILM: 8-2 | 7 days supply | Qty: 18 | Fill #0

## 2016-05-28 MED FILL — PENICILLIN VK 500 MG TABLET: 500 | 10 days supply | Qty: 20 | Fill #0

## 2016-06-04 MED FILL — SUBOXONE 8 MG-2 MG SL FILM: 8-2 | 14 days supply | Qty: 35 | Fill #0

## 2016-06-18 MED FILL — SUBOXONE 8 MG-2 MG SL FILM: 8-2 | 14 days supply | Qty: 35 | Fill #0

## 2016-07-02 MED FILL — SUBOXONE 8 MG-2 MG SL FILM: 8-2 | 28 days supply | Qty: 70 | Fill #0

## 2016-07-30 MED FILL — SUBOXONE 8 MG-2 MG SL FILM: 8-2 | 28 days supply | Qty: 70 | Fill #0

## 2016-08-27 MED FILL — SUBOXONE 8 MG-2 MG SL FILM: 8-2 | 28 days supply | Qty: 84 | Fill #0

## 2016-10-07 MED FILL — ZUBSOLV 5.7-1.4 MG TAB SL: 5.7-1.4 | 7 days supply | Qty: 15 | Fill #0

## 2016-10-15 MED FILL — ZUBSOLV 5.7-1.4 MG TAB SL: 5.7-1.4 | 7 days supply | Qty: 15 | Fill #0

## 2017-09-01 ENCOUNTER — Ambulatory Visit (INDEPENDENT_AMBULATORY_CARE_PROVIDER_SITE_OTHER): Payer: Self-pay | Admitting: Otolaryngology

## 2020-03-24 ENCOUNTER — Encounter (HOSPITAL_BASED_OUTPATIENT_CLINIC_OR_DEPARTMENT_OTHER): Payer: Self-pay

## 2020-03-24 ENCOUNTER — Other Ambulatory Visit (HOSPITAL_BASED_OUTPATIENT_CLINIC_OR_DEPARTMENT_OTHER): Payer: Self-pay

## 2020-03-24 DIAGNOSIS — R0683 Snoring: Secondary | ICD-10-CM

## 2020-03-24 DIAGNOSIS — G4710 Hypersomnia, unspecified: Secondary | ICD-10-CM

## 2020-04-07 ENCOUNTER — Other Ambulatory Visit (HOSPITAL_COMMUNITY)
Admission: RE | Admit: 2020-04-07 | Discharge: 2020-04-07 | Disposition: A | Discharge status: Home / Self Care | Payer: Medicaid Other | Source: Ambulatory Visit | Attending: Neurology | Admitting: Neurology

## 2020-04-15 ENCOUNTER — Other Ambulatory Visit (HOSPITAL_COMMUNITY)
Admission: RE | Admit: 2020-04-15 | Discharge: 2020-04-15 | Disposition: A | Discharge status: Home / Self Care | Payer: Medicaid Other | Source: Ambulatory Visit | Attending: Neurology | Admitting: Neurology

## 2020-07-30 ENCOUNTER — Other Ambulatory Visit: Payer: Self-pay

## 2020-07-30 ENCOUNTER — Emergency Department (HOSPITAL_COMMUNITY)
Admission: EM | Admit: 2020-07-30 | Discharge: 2020-07-30 | Discharge status: Against Medical Advice | Payer: Medicaid Other

## 2020-07-30 NOTE — ED Notes (Signed)
Pt left prior to triage
